# Patient Record
Sex: Female | Born: 1940 | Race: Black or African American | Hispanic: No | Marital: Single | Smoking: Former smoker
Health system: Southern US, Community
[De-identification: ages and names within clinical notes are randomized; demographics above are authoritative.]

## PROBLEM LIST (undated history)

## (undated) ENCOUNTER — Inpatient Hospital Stay: Admission: EM | Payer: Self-pay | Source: Home / Self Care

## (undated) DIAGNOSIS — M1990 Unspecified osteoarthritis, unspecified site: Secondary | ICD-10-CM

## (undated) DIAGNOSIS — M109 Gout, unspecified: Secondary | ICD-10-CM

## (undated) DIAGNOSIS — E042 Nontoxic multinodular goiter: Secondary | ICD-10-CM

## (undated) DIAGNOSIS — C801 Malignant (primary) neoplasm, unspecified: Secondary | ICD-10-CM

## (undated) DIAGNOSIS — I10 Essential (primary) hypertension: Secondary | ICD-10-CM

## (undated) DIAGNOSIS — E119 Type 2 diabetes mellitus without complications: Secondary | ICD-10-CM

## (undated) DIAGNOSIS — G629 Polyneuropathy, unspecified: Secondary | ICD-10-CM

## (undated) DIAGNOSIS — I82409 Acute embolism and thrombosis of unspecified deep veins of unspecified lower extremity: Secondary | ICD-10-CM

## (undated) DIAGNOSIS — D696 Thrombocytopenia, unspecified: Secondary | ICD-10-CM

## (undated) DIAGNOSIS — G4730 Sleep apnea, unspecified: Secondary | ICD-10-CM

## (undated) DIAGNOSIS — E559 Vitamin D deficiency, unspecified: Secondary | ICD-10-CM

## (undated) DIAGNOSIS — E669 Obesity, unspecified: Secondary | ICD-10-CM

## (undated) DIAGNOSIS — I739 Peripheral vascular disease, unspecified: Secondary | ICD-10-CM

## (undated) DIAGNOSIS — E7800 Pure hypercholesterolemia, unspecified: Secondary | ICD-10-CM

## (undated) HISTORY — PX: MASTECTOMY: SHX3

## (undated) HISTORY — PX: BREAST SURGERY: SHX581

## (undated) HISTORY — PX: WISDOM TOOTH EXTRACTION: SHX21

## (undated) HISTORY — PX: JOINT REPLACEMENT: SHX530

---

## 2004-05-30 ENCOUNTER — Ambulatory Visit: Payer: Self-pay | Admitting: Internal Medicine

## 2005-06-18 ENCOUNTER — Ambulatory Visit: Payer: Self-pay | Admitting: Internal Medicine

## 2006-02-13 ENCOUNTER — Ambulatory Visit: Payer: Self-pay | Admitting: Internal Medicine

## 2006-02-23 ENCOUNTER — Ambulatory Visit: Payer: Self-pay | Admitting: Internal Medicine

## 2006-03-12 ENCOUNTER — Ambulatory Visit: Payer: Self-pay | Admitting: Otolaryngology

## 2006-03-30 ENCOUNTER — Ambulatory Visit: Payer: Self-pay | Admitting: Otolaryngology

## 2006-07-13 ENCOUNTER — Ambulatory Visit: Payer: Self-pay | Admitting: Internal Medicine

## 2006-08-28 ENCOUNTER — Ambulatory Visit: Payer: Self-pay | Admitting: Internal Medicine

## 2006-10-13 ENCOUNTER — Ambulatory Visit: Payer: Self-pay | Admitting: Otolaryngology

## 2006-12-17 ENCOUNTER — Ambulatory Visit: Payer: Self-pay | Admitting: Oncology

## 2006-12-28 ENCOUNTER — Ambulatory Visit: Payer: Self-pay | Admitting: Oncology

## 2007-01-14 ENCOUNTER — Other Ambulatory Visit: Payer: Self-pay

## 2007-01-14 ENCOUNTER — Ambulatory Visit: Payer: Self-pay | Admitting: General Surgery

## 2007-01-17 ENCOUNTER — Ambulatory Visit: Payer: Self-pay | Admitting: Oncology

## 2007-01-19 ENCOUNTER — Ambulatory Visit: Payer: Self-pay | Admitting: General Surgery

## 2007-02-16 ENCOUNTER — Ambulatory Visit: Payer: Self-pay | Admitting: Oncology

## 2007-04-23 ENCOUNTER — Ambulatory Visit: Payer: Self-pay | Admitting: Oncology

## 2007-04-27 ENCOUNTER — Ambulatory Visit: Payer: Self-pay | Admitting: Oncology

## 2007-05-19 ENCOUNTER — Ambulatory Visit: Payer: Self-pay | Admitting: Oncology

## 2007-07-19 ENCOUNTER — Ambulatory Visit: Payer: Self-pay | Admitting: Oncology

## 2007-07-20 ENCOUNTER — Ambulatory Visit: Payer: Self-pay | Admitting: Oncology

## 2007-08-06 ENCOUNTER — Ambulatory Visit: Payer: Self-pay | Admitting: Otolaryngology

## 2007-08-19 ENCOUNTER — Ambulatory Visit: Payer: Self-pay | Admitting: Oncology

## 2007-09-10 ENCOUNTER — Ambulatory Visit: Payer: Self-pay | Admitting: Internal Medicine

## 2007-10-17 ENCOUNTER — Ambulatory Visit: Payer: Self-pay | Admitting: Oncology

## 2007-10-18 ENCOUNTER — Ambulatory Visit: Payer: Self-pay | Admitting: Oncology

## 2007-11-17 ENCOUNTER — Ambulatory Visit: Payer: Self-pay | Admitting: Oncology

## 2008-01-17 ENCOUNTER — Ambulatory Visit: Payer: Self-pay | Admitting: Oncology

## 2008-01-25 ENCOUNTER — Ambulatory Visit: Payer: Self-pay | Admitting: Oncology

## 2008-02-07 ENCOUNTER — Ambulatory Visit: Payer: Self-pay | Admitting: Internal Medicine

## 2008-02-16 ENCOUNTER — Ambulatory Visit: Payer: Self-pay | Admitting: Oncology

## 2008-02-17 ENCOUNTER — Ambulatory Visit: Payer: Self-pay | Admitting: Orthopedic Surgery

## 2008-05-16 ENCOUNTER — Ambulatory Visit: Payer: Self-pay | Admitting: Vascular Surgery

## 2008-06-19 ENCOUNTER — Ambulatory Visit: Payer: Self-pay | Admitting: Orthopedic Surgery

## 2008-06-27 ENCOUNTER — Inpatient Hospital Stay: Payer: Self-pay | Admitting: Orthopedic Surgery

## 2008-08-03 ENCOUNTER — Encounter: Payer: Self-pay | Admitting: Orthopedic Surgery

## 2008-08-18 ENCOUNTER — Encounter: Payer: Self-pay | Admitting: Orthopedic Surgery

## 2008-08-22 ENCOUNTER — Ambulatory Visit: Payer: Self-pay | Admitting: Internal Medicine

## 2008-09-19 ENCOUNTER — Ambulatory Visit: Payer: Self-pay | Admitting: Vascular Surgery

## 2008-09-20 ENCOUNTER — Encounter: Payer: Self-pay | Admitting: Orthopedic Surgery

## 2008-09-28 ENCOUNTER — Ambulatory Visit: Payer: Self-pay | Admitting: Orthopedic Surgery

## 2008-10-09 ENCOUNTER — Ambulatory Visit: Payer: Self-pay | Admitting: Internal Medicine

## 2009-02-26 ENCOUNTER — Ambulatory Visit: Payer: Self-pay | Admitting: Orthopedic Surgery

## 2009-05-23 ENCOUNTER — Encounter: Payer: Self-pay | Admitting: Orthopedic Surgery

## 2009-10-16 ENCOUNTER — Ambulatory Visit: Payer: Self-pay | Admitting: Oncology

## 2009-10-16 ENCOUNTER — Ambulatory Visit: Payer: Self-pay | Admitting: Internal Medicine

## 2009-10-19 ENCOUNTER — Ambulatory Visit: Payer: Self-pay | Admitting: Internal Medicine

## 2009-10-30 ENCOUNTER — Ambulatory Visit: Payer: Self-pay | Admitting: Oncology

## 2009-11-16 ENCOUNTER — Ambulatory Visit: Payer: Self-pay | Admitting: Oncology

## 2009-11-19 ENCOUNTER — Ambulatory Visit: Payer: Self-pay | Admitting: Surgery

## 2009-12-04 ENCOUNTER — Ambulatory Visit: Payer: Self-pay | Admitting: Surgery

## 2009-12-11 ENCOUNTER — Ambulatory Visit: Payer: Self-pay | Admitting: Surgery

## 2009-12-11 ENCOUNTER — Ambulatory Visit: Payer: Self-pay | Admitting: Oncology

## 2009-12-16 ENCOUNTER — Ambulatory Visit: Payer: Self-pay | Admitting: Oncology

## 2009-12-19 ENCOUNTER — Ambulatory Visit: Payer: Self-pay | Admitting: Surgery

## 2010-01-16 ENCOUNTER — Ambulatory Visit: Payer: Self-pay | Admitting: Oncology

## 2010-01-28 ENCOUNTER — Ambulatory Visit: Payer: Self-pay | Admitting: Surgery

## 2010-01-29 ENCOUNTER — Ambulatory Visit: Payer: Self-pay | Admitting: Oncology

## 2010-02-01 ENCOUNTER — Ambulatory Visit: Payer: Self-pay | Admitting: Surgery

## 2010-02-15 ENCOUNTER — Ambulatory Visit: Payer: Self-pay | Admitting: Oncology

## 2010-03-18 ENCOUNTER — Ambulatory Visit: Payer: Self-pay | Admitting: Oncology

## 2010-04-18 ENCOUNTER — Ambulatory Visit: Payer: Self-pay | Admitting: Oncology

## 2010-05-18 ENCOUNTER — Ambulatory Visit: Payer: Self-pay | Admitting: Oncology

## 2010-06-18 ENCOUNTER — Ambulatory Visit: Payer: Self-pay | Admitting: Oncology

## 2010-07-18 ENCOUNTER — Ambulatory Visit: Payer: Self-pay | Admitting: Oncology

## 2010-08-18 ENCOUNTER — Ambulatory Visit: Payer: Self-pay | Admitting: Radiation Oncology

## 2010-08-18 ENCOUNTER — Ambulatory Visit: Payer: Self-pay | Admitting: Oncology

## 2010-09-18 ENCOUNTER — Ambulatory Visit: Payer: Self-pay | Admitting: Oncology

## 2010-09-26 ENCOUNTER — Ambulatory Visit: Payer: Self-pay | Admitting: Otolaryngology

## 2010-11-12 IMAGING — CT CT CHEST-ABD-PELV W/ CM
1 of 3 series · 14 of 32 positions shown, 19 images · non-contrast
Comparison: none

REASON FOR EXAM: breast CA  eval mets  pt is diabetic takes Metformin
COMMENTS:

[Series 3: soft tissue · axial · 0.93mm/px · z∈[-601,-216]mm · 14 of 89 slices shown, 19 images]
[im 6/89  soft-tissue]
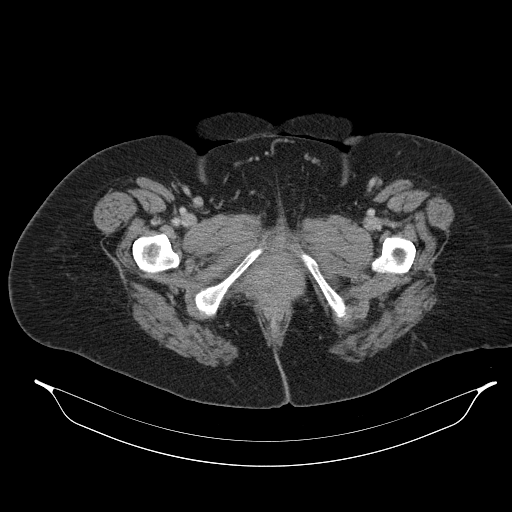
[im 6/89  bone]
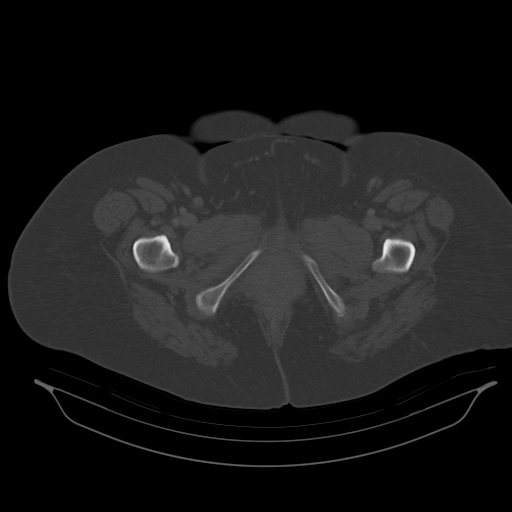
[im 11/89  soft-tissue]
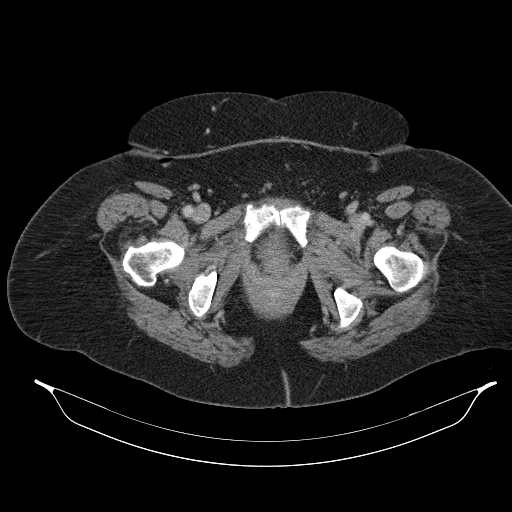
[im 21/89  soft-tissue]
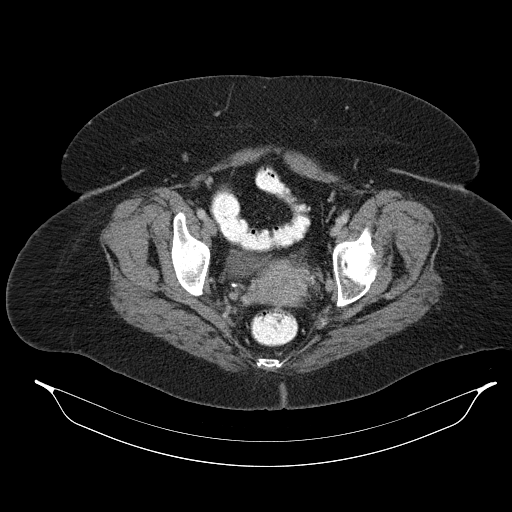
[im 26/89  soft-tissue]
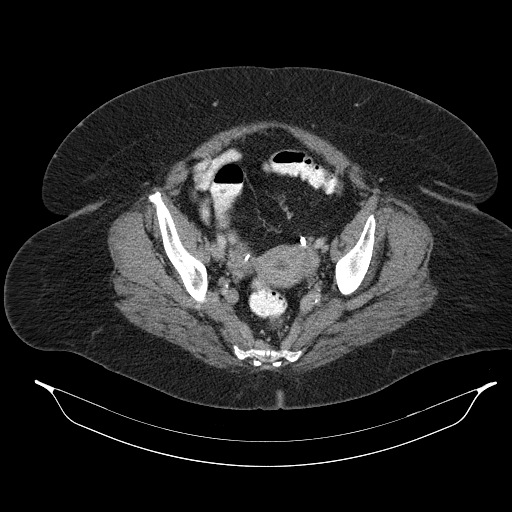
[im 32/89  soft-tissue]
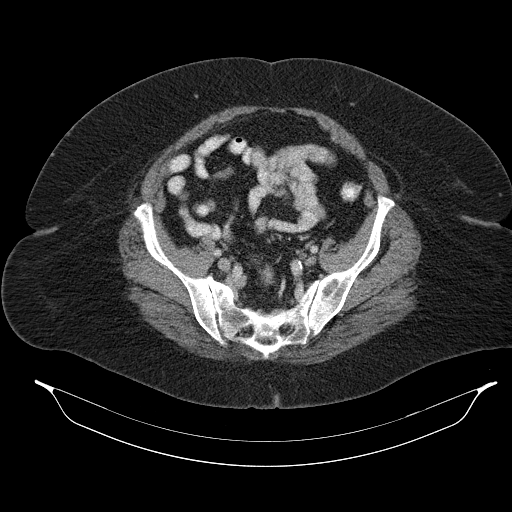
[im 37/89  soft-tissue]
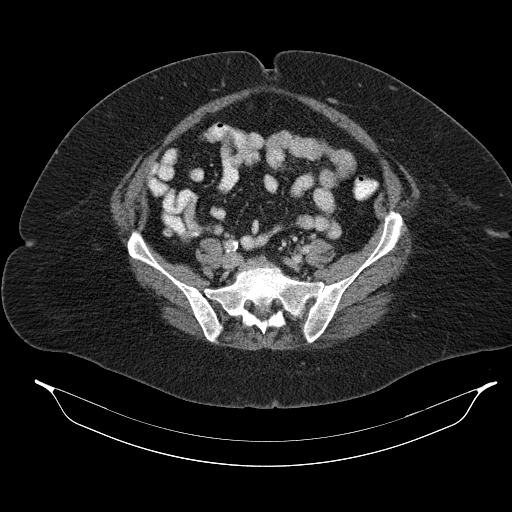
[im 47/89  soft-tissue]
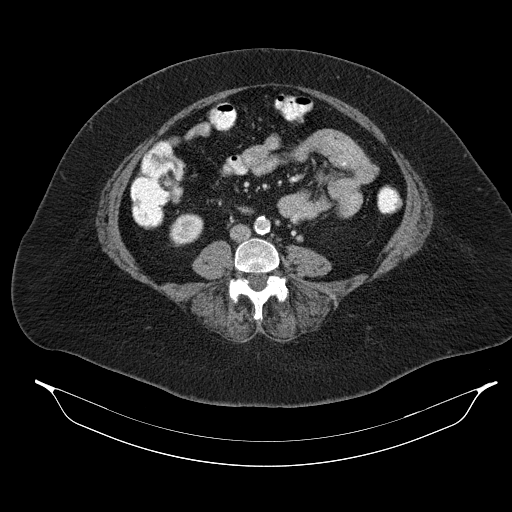
[im 52/89  soft-tissue]
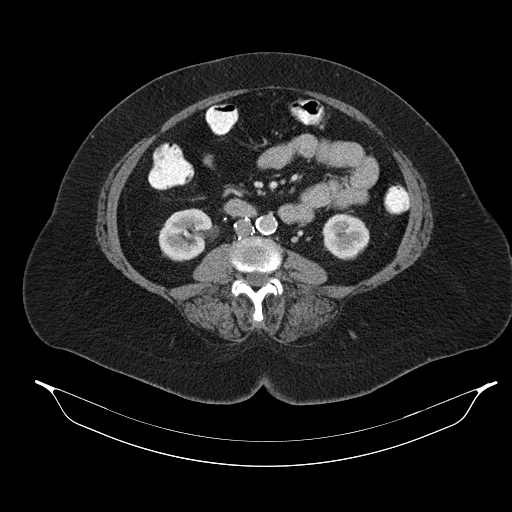
[im 57/89  soft-tissue]
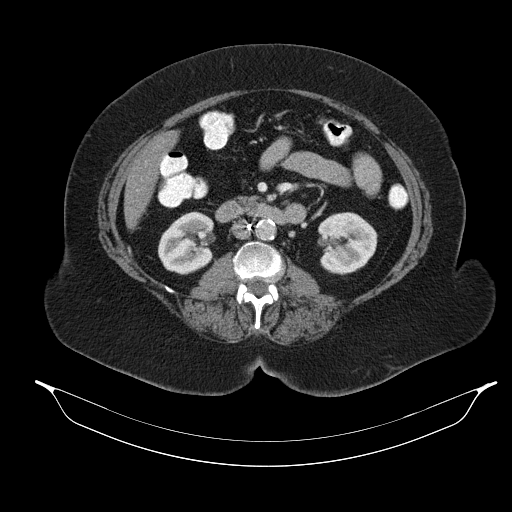
[im 57/89  bone]
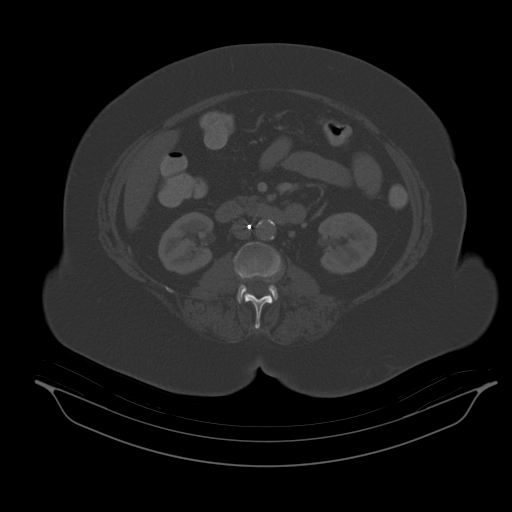
[im 63/89  soft-tissue]
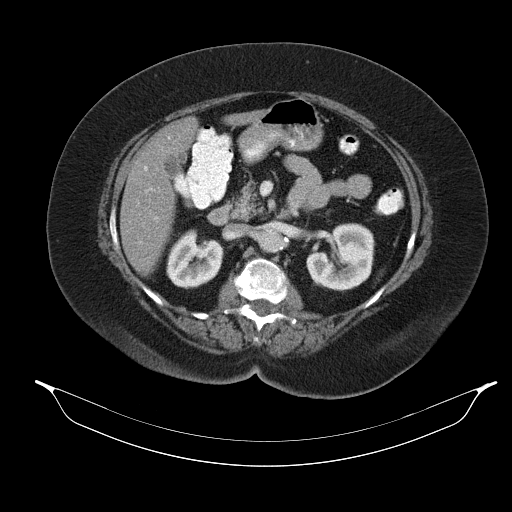
[im 68/89  soft-tissue]
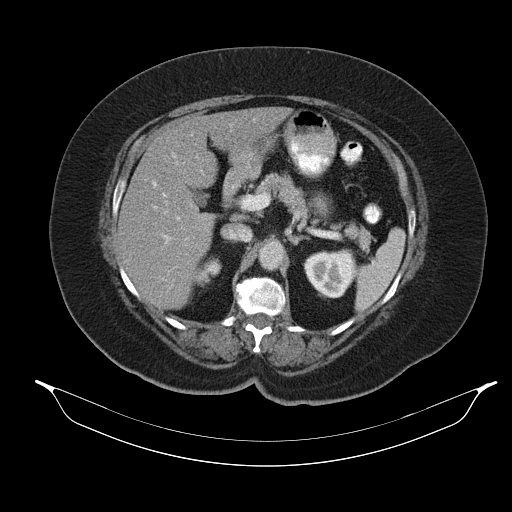
[im 68/89  lung]
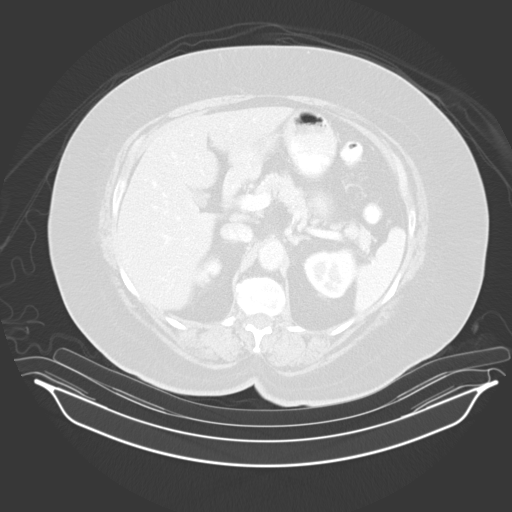
[im 73/89  lung]
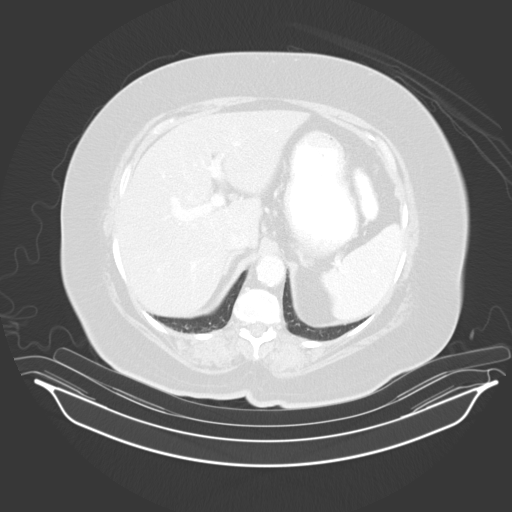
[im 78/89  soft-tissue]
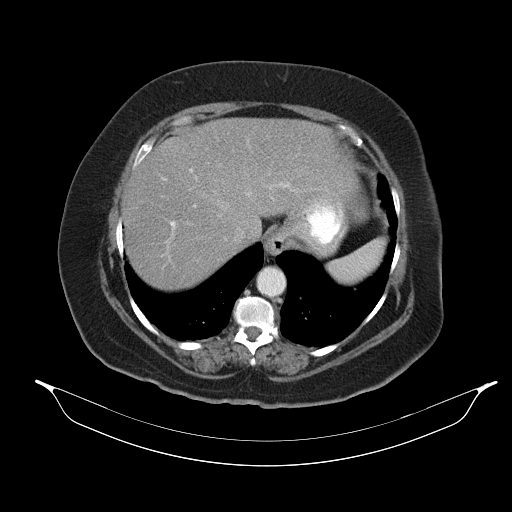
[im 78/89  lung]
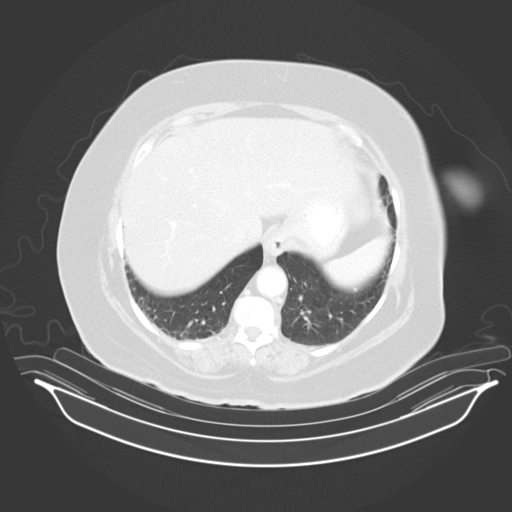
[im 83/89  soft-tissue]
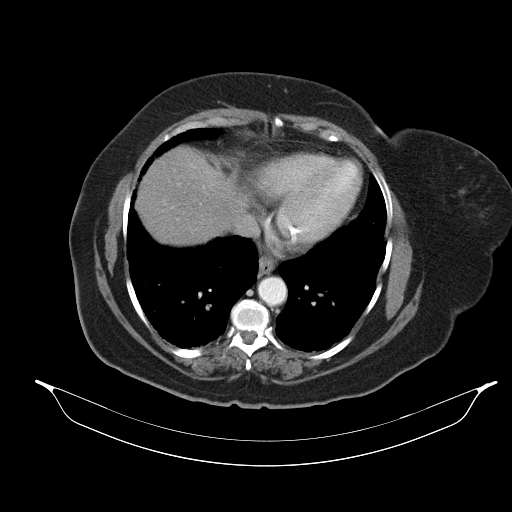
[im 83/89  lung]
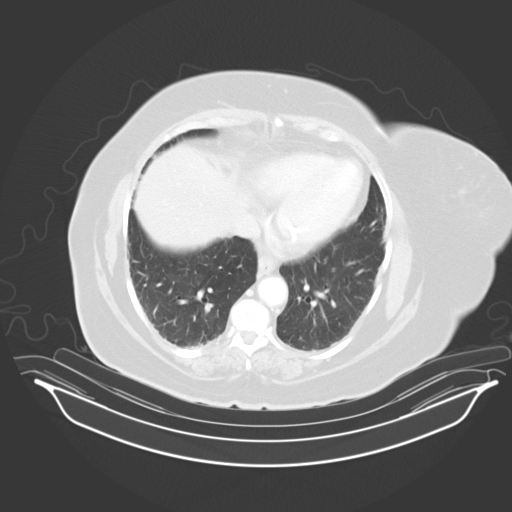

[14 of 32 positions shown; findings below may reference images not displayed]

PROCEDURE:     NAGRAJ - NAGRAJ CHEST ABDOMEN AND PELVIS W  - December 04, 2009  [DATE]

RESULT:     CT of the chest, abdomen and pelvis is performed utilizing 100
mL of 0sovue-SOS iodinated intravenous contrast with oral contrast also
administered. The patient has previous CT dated 12/30/2006 for comparison.
There is also a study dated 02/13/2006.

The patient is status post right mastectomy. No pleural or pericardial
effusion is evident. The heart is borderline enlarged. The thoracic aorta is
normal in caliber without dissection. A definite pulmonary parenchymal mass
is not appreciated. There is no mediastinal, hilar or axillary mass or
adenopathy evident.

The liver, spleen, adrenal glands, pancreas, nondistended gallbladder and
abdominal aorta appear to be unremarkable except for atherosclerotic
calcification. An inferior vena cava filter is present. The legs of the
filter extend past the wall of the inferior vena cava. The uterus and
urinary bladder appear to be unremarkable. There is no evidence of abnormal
bowel distention or bowel wall thickening. The appendix is normal. No
adenopathy is evident. There is no ascites or abscess. The bony structures
are unremarkable.
IMPRESSION: 1. Right mastectomy changes.
2. No focal pulmonary parenchymal mass.
3. Inferior vena cava filter present as described.
4. No adenopathy or metastatic disease demonstrated.

## 2010-12-17 ENCOUNTER — Ambulatory Visit: Payer: Self-pay | Admitting: Oncology

## 2010-12-25 ENCOUNTER — Ambulatory Visit: Payer: Self-pay | Admitting: Otolaryngology

## 2011-01-17 ENCOUNTER — Ambulatory Visit: Payer: Self-pay | Admitting: Oncology

## 2011-03-14 ENCOUNTER — Ambulatory Visit: Payer: Self-pay | Admitting: Oncology

## 2011-03-19 ENCOUNTER — Ambulatory Visit: Payer: Self-pay | Admitting: Oncology

## 2011-06-27 ENCOUNTER — Ambulatory Visit: Payer: Self-pay | Admitting: Otolaryngology

## 2012-03-12 ENCOUNTER — Ambulatory Visit: Payer: Self-pay | Admitting: Oncology

## 2012-03-18 ENCOUNTER — Ambulatory Visit: Payer: Self-pay | Admitting: Oncology

## 2012-04-16 LAB — COMPREHENSIVE METABOLIC PANEL
Alkaline Phosphatase: 99 U/L (ref 50–136)
Anion Gap: 6 — ABNORMAL LOW (ref 7–16)
Calcium, Total: 9.2 mg/dL (ref 8.5–10.1)
Chloride: 105 mmol/L (ref 98–107)
Co2: 29 mmol/L (ref 21–32)
Creatinine: 0.81 mg/dL (ref 0.60–1.30)
EGFR (African American): >60
EGFR (Non-African Amer.): >60
Osmolality: 286 (ref 275–301)
SGOT(AST): 23 U/L (ref 15–37)
SGPT (ALT): 35 U/L (ref 12–78)

## 2012-04-16 LAB — CBC CANCER CENTER
Basophil #: 0 x10 3/mm (ref 0.0–0.1)
Basophil %: 0.5 %
Eosinophil %: 3.8 %
HCT: 41.8 % (ref 35.0–47.0)
HGB: 13.6 g/dL (ref 12.0–16.0)
Lymphocyte %: 20 %
Monocyte #: 0.6 x10 3/mm (ref 0.2–0.9)
Monocyte %: 8.5 %
Neutrophil #: 4.5 x10 3/mm (ref 1.4–6.5)
Neutrophil %: 67.2 %
RBC: 4.42 10*6/uL (ref 3.80–5.20)
RDW: 16.3 % — ABNORMAL HIGH (ref 11.5–14.5)
WBC: 6.7 x10 3/mm (ref 3.6–11.0)

## 2012-04-18 ENCOUNTER — Ambulatory Visit: Payer: Self-pay | Admitting: Oncology

## 2012-05-06 ENCOUNTER — Ambulatory Visit: Payer: Self-pay | Admitting: Surgery

## 2012-05-14 ENCOUNTER — Ambulatory Visit: Payer: Self-pay | Admitting: Surgery

## 2012-06-30 ENCOUNTER — Ambulatory Visit: Payer: Self-pay | Admitting: Otolaryngology

## 2012-06-30 LAB — TSH: Thyroid Stimulating Horm: 1.11 u[IU]/mL

## 2012-09-28 ENCOUNTER — Ambulatory Visit: Payer: Self-pay | Admitting: Internal Medicine

## 2012-10-16 ENCOUNTER — Ambulatory Visit: Payer: Self-pay | Admitting: Internal Medicine

## 2012-11-05 ENCOUNTER — Ambulatory Visit: Payer: Self-pay | Admitting: Oncology

## 2012-11-05 LAB — CBC CANCER CENTER
Basophil #: 0.1 x10 3/mm (ref 0.0–0.1)
Basophil %: 0.7 %
Eosinophil #: 0.3 x10 3/mm (ref 0.0–0.7)
Eosinophil %: 4.2 %
HCT: 39.3 % (ref 35.0–47.0)
Lymphocyte #: 1.9 x10 3/mm (ref 1.0–3.6)
Lymphocyte %: 23.6 %
MCHC: 31.9 g/dL — ABNORMAL LOW (ref 32.0–36.0)
MCV: 94 fL (ref 80–100)
Monocyte #: 0.8 x10 3/mm (ref 0.2–0.9)
Neutrophil %: 61.3 %
Platelet: 150 x10 3/mm (ref 150–440)

## 2012-11-05 LAB — COMPREHENSIVE METABOLIC PANEL
Albumin: 3.5 g/dL (ref 3.4–5.0)
Alkaline Phosphatase: 93 U/L (ref 50–136)
BUN: 40 mg/dL — ABNORMAL HIGH (ref 7–18)
Bilirubin,Total: 0.4 mg/dL (ref 0.2–1.0)
Chloride: 102 mmol/L (ref 98–107)
Co2: 27 mmol/L (ref 21–32)
Creatinine: 1.11 mg/dL (ref 0.60–1.30)
Glucose: 178 mg/dL — ABNORMAL HIGH (ref 65–99)
Osmolality: 294 (ref 275–301)
Potassium: 4.1 mmol/L (ref 3.5–5.1)
SGOT(AST): 21 U/L (ref 15–37)
SGPT (ALT): 30 U/L (ref 12–78)

## 2012-11-16 ENCOUNTER — Ambulatory Visit: Payer: Self-pay | Admitting: Oncology

## 2012-12-21 ENCOUNTER — Ambulatory Visit: Payer: Self-pay | Admitting: Internal Medicine

## 2013-01-16 ENCOUNTER — Ambulatory Visit: Payer: Self-pay | Admitting: Internal Medicine

## 2013-02-15 ENCOUNTER — Ambulatory Visit: Payer: Self-pay | Admitting: Oncology

## 2013-03-18 ENCOUNTER — Ambulatory Visit: Payer: Self-pay | Admitting: Oncology

## 2013-04-18 ENCOUNTER — Ambulatory Visit: Payer: Self-pay | Admitting: Oncology

## 2013-05-06 LAB — CBC CANCER CENTER
Basophil %: 0.7 %
Eosinophil #: 0.2 x10 3/mm (ref 0.0–0.7)
Eosinophil %: 3.2 %
HCT: 39.5 % (ref 35.0–47.0)
HGB: 12.7 g/dL (ref 12.0–16.0)
Lymphocyte #: 1.4 x10 3/mm (ref 1.0–3.6)
Lymphocyte %: 20.4 %
MCH: 29.8 pg (ref 26.0–34.0)
MCHC: 32.3 g/dL (ref 32.0–36.0)
MCV: 92 fL (ref 80–100)
Monocyte #: 0.6 x10 3/mm (ref 0.2–0.9)
Neutrophil %: 67.1 %
Platelet: 145 x10 3/mm — ABNORMAL LOW (ref 150–440)
RBC: 4.28 10*6/uL (ref 3.80–5.20)
RDW: 15.2 % — ABNORMAL HIGH (ref 11.5–14.5)
WBC: 7 x10 3/mm (ref 3.6–11.0)

## 2013-05-06 LAB — COMPREHENSIVE METABOLIC PANEL
Albumin: 3.6 g/dL (ref 3.4–5.0)
Anion Gap: 7 (ref 7–16)
BUN: 26 mg/dL — ABNORMAL HIGH (ref 7–18)
Bilirubin,Total: 0.4 mg/dL (ref 0.2–1.0)
Chloride: 108 mmol/L — ABNORMAL HIGH (ref 98–107)
Co2: 29 mmol/L (ref 21–32)
Creatinine: 0.94 mg/dL (ref 0.60–1.30)
EGFR (Non-African Amer.): 60 — ABNORMAL LOW
Glucose: 204 mg/dL — ABNORMAL HIGH (ref 65–99)
Osmolality: 297 (ref 275–301)
Potassium: 4.2 mmol/L (ref 3.5–5.1)
SGPT (ALT): 40 U/L (ref 12–78)
Total Protein: 7.7 g/dL (ref 6.4–8.2)

## 2013-05-18 ENCOUNTER — Ambulatory Visit: Payer: Self-pay | Admitting: Oncology

## 2013-07-28 ENCOUNTER — Ambulatory Visit: Payer: Self-pay | Admitting: Otolaryngology

## 2013-07-29 LAB — TSH: Thyroid Stimulating Horm: 1.9 u[IU]/mL

## 2013-07-29 LAB — T4, FREE: Free Thyroxine: 1.08 ng/dL (ref 0.76–1.46)

## 2013-09-20 ENCOUNTER — Emergency Department: Payer: Self-pay | Admitting: Emergency Medicine

## 2013-11-09 ENCOUNTER — Ambulatory Visit: Payer: Self-pay | Admitting: Oncology

## 2013-11-16 ENCOUNTER — Ambulatory Visit: Payer: Self-pay | Admitting: Oncology

## 2013-11-26 DIAGNOSIS — D649 Anemia, unspecified: Secondary | ICD-10-CM | POA: Insufficient documentation

## 2013-11-26 DIAGNOSIS — Z9660 Presence of unspecified orthopedic joint implant: Secondary | ICD-10-CM | POA: Insufficient documentation

## 2013-11-26 DIAGNOSIS — L609 Nail disorder, unspecified: Secondary | ICD-10-CM | POA: Insufficient documentation

## 2013-11-26 DIAGNOSIS — E7800 Pure hypercholesterolemia, unspecified: Secondary | ICD-10-CM | POA: Insufficient documentation

## 2013-11-26 DIAGNOSIS — I10 Essential (primary) hypertension: Secondary | ICD-10-CM | POA: Insufficient documentation

## 2013-12-29 DIAGNOSIS — J84112 Idiopathic pulmonary fibrosis: Secondary | ICD-10-CM | POA: Insufficient documentation

## 2013-12-29 DIAGNOSIS — E669 Obesity, unspecified: Secondary | ICD-10-CM | POA: Insufficient documentation

## 2013-12-29 DIAGNOSIS — G4733 Obstructive sleep apnea (adult) (pediatric): Secondary | ICD-10-CM | POA: Insufficient documentation

## 2014-04-13 DIAGNOSIS — E119 Type 2 diabetes mellitus without complications: Secondary | ICD-10-CM | POA: Insufficient documentation

## 2014-04-13 DIAGNOSIS — M109 Gout, unspecified: Secondary | ICD-10-CM | POA: Insufficient documentation

## 2014-05-20 DIAGNOSIS — M1710 Unilateral primary osteoarthritis, unspecified knee: Secondary | ICD-10-CM | POA: Insufficient documentation

## 2014-05-20 DIAGNOSIS — M179 Osteoarthritis of knee, unspecified: Secondary | ICD-10-CM | POA: Insufficient documentation

## 2014-05-20 DIAGNOSIS — E1140 Type 2 diabetes mellitus with diabetic neuropathy, unspecified: Secondary | ICD-10-CM | POA: Insufficient documentation

## 2014-05-20 DIAGNOSIS — E559 Vitamin D deficiency, unspecified: Secondary | ICD-10-CM | POA: Insufficient documentation

## 2014-05-26 ENCOUNTER — Ambulatory Visit: Payer: Self-pay | Admitting: Oncology

## 2014-05-26 LAB — CBC CANCER CENTER
Basophil #: 0.1 x10 3/mm (ref 0.0–0.1)
Basophil %: 0.7 %
EOS ABS: 0.3 x10 3/mm (ref 0.0–0.7)
Eosinophil %: 4.8 %
HCT: 39.3 % (ref 35.0–47.0)
HGB: 12.7 g/dL (ref 12.0–16.0)
LYMPHS ABS: 1.9 x10 3/mm (ref 1.0–3.6)
Lymphocyte %: 26.1 %
MCH: 29.1 pg (ref 26.0–34.0)
MCHC: 32.2 g/dL (ref 32.0–36.0)
MCV: 90 fL (ref 80–100)
Monocyte #: 0.7 x10 3/mm (ref 0.2–0.9)
Monocyte %: 9.8 %
NEUTROS PCT: 58.6 %
Neutrophil #: 4.2 x10 3/mm (ref 1.4–6.5)
Platelet: 149 x10 3/mm — ABNORMAL LOW (ref 150–440)
RBC: 4.36 10*6/uL (ref 3.80–5.20)
RDW: 15.9 % — ABNORMAL HIGH (ref 11.5–14.5)
WBC: 7.2 x10 3/mm (ref 3.6–11.0)

## 2014-05-26 LAB — COMPREHENSIVE METABOLIC PANEL
ALBUMIN: 3.5 g/dL (ref 3.4–5.0)
ALK PHOS: 79 U/L
ALT: 21 U/L
ANION GAP: 6 — AB (ref 7–16)
AST: 18 U/L (ref 15–37)
BUN: 23 mg/dL — ABNORMAL HIGH (ref 7–18)
Bilirubin,Total: 0.3 mg/dL (ref 0.2–1.0)
CHLORIDE: 104 mmol/L (ref 98–107)
CREATININE: 1.02 mg/dL (ref 0.60–1.30)
Calcium, Total: 9.1 mg/dL (ref 8.5–10.1)
Co2: 30 mmol/L (ref 21–32)
EGFR (Non-African Amer.): 56 — ABNORMAL LOW
Glucose: 246 mg/dL — ABNORMAL HIGH (ref 65–99)
Osmolality: 291 (ref 275–301)
Potassium: 4.6 mmol/L (ref 3.5–5.1)
Sodium: 140 mmol/L (ref 136–145)
Total Protein: 7.4 g/dL (ref 6.4–8.2)

## 2014-06-18 ENCOUNTER — Ambulatory Visit: Payer: Self-pay | Admitting: Oncology

## 2014-07-31 ENCOUNTER — Ambulatory Visit: Payer: Self-pay | Admitting: Otolaryngology

## 2014-07-31 LAB — T4, FREE: Free Thyroxine: 1.05 ng/dL (ref 0.76–1.46)

## 2014-07-31 LAB — TSH: Thyroid Stimulating Horm: 1.44 u[IU]/mL

## 2014-11-22 ENCOUNTER — Ambulatory Visit
Admit: 2014-11-22 | Disposition: A | Discharge status: Home / Self Care | Payer: Self-pay | Attending: Hematology and Oncology | Admitting: Hematology and Oncology

## 2014-11-22 LAB — CBC CANCER CENTER
Basophil #: 0 x10 3/mm (ref 0.0–0.1)
Basophil %: 0.6 %
Eosinophil #: 0.2 x10 3/mm (ref 0.0–0.7)
Eosinophil %: 3.2 %
HCT: 40.4 % (ref 35.0–47.0)
HGB: 13.2 g/dL (ref 12.0–16.0)
Lymphocyte #: 1.5 x10 3/mm (ref 1.0–3.6)
Lymphocyte %: 20.6 %
MCH: 28.8 pg (ref 26.0–34.0)
MCHC: 32.7 g/dL (ref 32.0–36.0)
MCV: 88 fL (ref 80–100)
Monocyte #: 0.7 x10 3/mm (ref 0.2–0.9)
Monocyte %: 8.9 %
Neutrophil #: 5 x10 3/mm (ref 1.4–6.5)
Neutrophil %: 66.7 %
Platelet: 172 x10 3/mm (ref 150–440)
RBC: 4.58 10*6/uL (ref 3.80–5.20)
RDW: 15 % — ABNORMAL HIGH (ref 11.5–14.5)
WBC: 7.5 x10 3/mm (ref 3.6–11.0)

## 2014-11-22 LAB — COMPREHENSIVE METABOLIC PANEL
Albumin: 4.4 g/dL
Alkaline Phosphatase: 68 U/L
Anion Gap: 8 (ref 7–16)
BUN: 24 mg/dL — ABNORMAL HIGH
Bilirubin,Total: 0.6 mg/dL
Calcium, Total: 9.5 mg/dL
Chloride: 100 mmol/L — ABNORMAL LOW
Co2: 29 mmol/L
Creatinine: 0.78 mg/dL
EGFR (African American): >60
EGFR (Non-African Amer.): >60
Glucose: 272 mg/dL — ABNORMAL HIGH
Potassium: 4.4 mmol/L
SGOT(AST): 21 U/L
SGPT (ALT): 17 U/L
Sodium: 137 mmol/L
Total Protein: 7.7 g/dL

## 2014-11-23 LAB — CANCER ANTIGEN 27.29: CA 27.29: 39.1 U/mL — ABNORMAL HIGH (ref 0.0–38.6)

## 2014-11-24 DIAGNOSIS — Z79899 Other long term (current) drug therapy: Secondary | ICD-10-CM | POA: Insufficient documentation

## 2014-11-24 DIAGNOSIS — J301 Allergic rhinitis due to pollen: Secondary | ICD-10-CM | POA: Insufficient documentation

## 2014-12-05 NOTE — Op Note (Signed)
PATIENT NAME:  Crystal Frederick, Crystal Frederick MR#:  [id number] DATE OF BIRTH:  11-Jun-1941  DATE OF PROCEDURE:  05/14/2012  PREOPERATIVE DIAGNOSIS: Breast carcinoma.   POSTOPERATIVE DIAGNOSIS: Breast carcinoma.   PROCEDURE: Port-A-Cath removal.   SURGEON: Rodena Goldmann III, MD  ASSISTANT: Doristine Locks, PA student   ANESTHESIA: Monitored anesthetic care    DESCRIPTION OF PROCEDURE: With the patient in supine position after induction of appropriate intravenous sedation, the patient's right chest and Port-A-Cath device were prepped with ChloraPrep and draped with sterile towels. 1% Xylocaine buffered with sodium bicarbonate was injected over the palpable nodule. The area was incised and carried down through the subcutaneous tissue, bovied with electrocautery. The device was easily elevated into the wound and detached from surrounding tissue without difficulty. The catheter was then removed. Did not appear to be any evidence of a break in the catheter and the catheter was removed in total. The sinus tract was closed with two sutures of 3-0 Vicryl as it was bleeding spontaneously. Subcutaneous space obliterated with 3-0 Vicryl and the skin was closed 4-0 nylon. Sterile dressings were applied. Patient returned to the recovery room having tolerated procedure well. Sponge, instrument, and needle counts were correct x2 in the Operating Room.   ____________________________ Micheline Maze, MD rle:cms D: 05/14/2012 10:19:35 ET T: 05/14/2012 12:32:26 ET JOB#: [id number]  cc: Micheline Maze, MD, <Dictator> Christena Flake. Raechel Ache, MD  Rodena Goldmann MD ELECTRONICALLY SIGNED 05/16/2012 16:27

## 2014-12-14 LAB — CANCER ANTIGEN 27.29: CA 27.29: 44.1 U/mL — ABNORMAL HIGH (ref 0.0–38.6)

## 2014-12-21 DIAGNOSIS — I6381 Other cerebral infarction due to occlusion or stenosis of small artery: Secondary | ICD-10-CM | POA: Insufficient documentation

## 2014-12-28 ENCOUNTER — Other Ambulatory Visit: Payer: Self-pay | Admitting: Family Medicine

## 2015-01-01 ENCOUNTER — Other Ambulatory Visit: Payer: Self-pay

## 2015-01-01 ENCOUNTER — Telehealth: Payer: Self-pay | Admitting: Hematology and Oncology

## 2015-01-01 MED ORDER — ANASTROZOLE 1 MG PO TABS: 1.0000 mg | ORAL_TABLET | Freq: Every day | ORAL | Status: DC

## 2015-01-01 NOTE — Telephone Encounter (Signed)
She needs her Anastrazole prescription renewed. She went to pharmacy to get it and they said they needed a new Rx. She goes to [facility]. This is the second day that she will be without it.

## 2015-01-01 NOTE — Telephone Encounter (Signed)
rx sent to pharmacy

## 2015-01-24 ENCOUNTER — Other Ambulatory Visit: Payer: Self-pay

## 2015-01-24 ENCOUNTER — Inpatient Hospital Stay: Payer: Medicare Other | Attending: Hematology and Oncology

## 2015-01-24 DIAGNOSIS — C50919 Malignant neoplasm of unspecified site of unspecified female breast: Secondary | ICD-10-CM

## 2015-01-24 DIAGNOSIS — Z79811 Long term (current) use of aromatase inhibitors: Secondary | ICD-10-CM | POA: Insufficient documentation

## 2015-01-24 DIAGNOSIS — Z170 Estrogen receptor positive status [ER+]: Secondary | ICD-10-CM | POA: Insufficient documentation

## 2015-01-25 LAB — CANCER ANTIGEN 27.29: CA 27.29: 35.5 U/mL (ref 0.0–38.6)

## 2015-02-23 DIAGNOSIS — G44209 Tension-type headache, unspecified, not intractable: Secondary | ICD-10-CM | POA: Insufficient documentation

## 2015-03-05 ENCOUNTER — Ambulatory Visit: Payer: Medicare Other | Admitting: Anesthesiology

## 2015-03-05 ENCOUNTER — Ambulatory Visit
Admission: RE | Admit: 2015-03-05 | Discharge: 2015-03-05 | Disposition: A | Discharge status: Home / Self Care | Payer: Medicare Other | Source: Ambulatory Visit | Attending: Unknown Physician Specialty | Admitting: Unknown Physician Specialty

## 2015-03-05 ENCOUNTER — Encounter: Payer: Self-pay | Admitting: *Deleted

## 2015-03-05 ENCOUNTER — Encounter
Admission: RE | Disposition: A | Discharge status: Home / Self Care | Payer: Self-pay | Source: Ambulatory Visit | Attending: Unknown Physician Specialty

## 2015-03-05 DIAGNOSIS — M109 Gout, unspecified: Secondary | ICD-10-CM | POA: Insufficient documentation

## 2015-03-05 DIAGNOSIS — Z1211 Encounter for screening for malignant neoplasm of colon: Secondary | ICD-10-CM | POA: Diagnosis present

## 2015-03-05 DIAGNOSIS — K648 Other hemorrhoids: Secondary | ICD-10-CM | POA: Diagnosis not present

## 2015-03-05 DIAGNOSIS — Z86718 Personal history of other venous thrombosis and embolism: Secondary | ICD-10-CM | POA: Diagnosis not present

## 2015-03-05 DIAGNOSIS — D123 Benign neoplasm of transverse colon: Secondary | ICD-10-CM | POA: Diagnosis not present

## 2015-03-05 DIAGNOSIS — I739 Peripheral vascular disease, unspecified: Secondary | ICD-10-CM | POA: Diagnosis not present

## 2015-03-05 DIAGNOSIS — K633 Ulcer of intestine: Secondary | ICD-10-CM | POA: Diagnosis not present

## 2015-03-05 DIAGNOSIS — I10 Essential (primary) hypertension: Secondary | ICD-10-CM | POA: Insufficient documentation

## 2015-03-05 DIAGNOSIS — Z87891 Personal history of nicotine dependence: Secondary | ICD-10-CM | POA: Diagnosis not present

## 2015-03-05 DIAGNOSIS — Z79899 Other long term (current) drug therapy: Secondary | ICD-10-CM | POA: Insufficient documentation

## 2015-03-05 DIAGNOSIS — Z9660 Presence of unspecified orthopedic joint implant: Secondary | ICD-10-CM | POA: Diagnosis not present

## 2015-03-05 DIAGNOSIS — Z6836 Body mass index (BMI) 36.0-36.9, adult: Secondary | ICD-10-CM | POA: Insufficient documentation

## 2015-03-05 DIAGNOSIS — G4730 Sleep apnea, unspecified: Secondary | ICD-10-CM | POA: Insufficient documentation

## 2015-03-05 DIAGNOSIS — E780 Pure hypercholesterolemia: Secondary | ICD-10-CM | POA: Insufficient documentation

## 2015-03-05 DIAGNOSIS — E669 Obesity, unspecified: Secondary | ICD-10-CM | POA: Insufficient documentation

## 2015-03-05 DIAGNOSIS — K529 Noninfective gastroenteritis and colitis, unspecified: Secondary | ICD-10-CM | POA: Diagnosis not present

## 2015-03-05 DIAGNOSIS — E559 Vitamin D deficiency, unspecified: Secondary | ICD-10-CM | POA: Diagnosis not present

## 2015-03-05 DIAGNOSIS — K5730 Diverticulosis of large intestine without perforation or abscess without bleeding: Secondary | ICD-10-CM | POA: Insufficient documentation

## 2015-03-05 DIAGNOSIS — E119 Type 2 diabetes mellitus without complications: Secondary | ICD-10-CM | POA: Insufficient documentation

## 2015-03-05 HISTORY — DX: Obesity, unspecified: E66.9

## 2015-03-05 HISTORY — DX: Pure hypercholesterolemia, unspecified: E78.00

## 2015-03-05 HISTORY — DX: Nontoxic multinodular goiter: E04.2

## 2015-03-05 HISTORY — DX: Acute embolism and thrombosis of unspecified deep veins of unspecified lower extremity: I82.409

## 2015-03-05 HISTORY — PX: COLONOSCOPY WITH PROPOFOL: SHX5780

## 2015-03-05 HISTORY — DX: Malignant (primary) neoplasm, unspecified: C80.1

## 2015-03-05 HISTORY — DX: Essential (primary) hypertension: I10

## 2015-03-05 HISTORY — DX: Polyneuropathy, unspecified: G62.9

## 2015-03-05 HISTORY — DX: Unspecified osteoarthritis, unspecified site: M19.90

## 2015-03-05 HISTORY — DX: Sleep apnea, unspecified: G47.30

## 2015-03-05 HISTORY — DX: Peripheral vascular disease, unspecified: I73.9

## 2015-03-05 HISTORY — DX: Vitamin D deficiency, unspecified: E55.9

## 2015-03-05 HISTORY — DX: Gout, unspecified: M10.9

## 2015-03-05 HISTORY — DX: Thrombocytopenia, unspecified: D69.6

## 2015-03-05 HISTORY — DX: Type 2 diabetes mellitus without complications: E11.9

## 2015-03-05 LAB — GLUCOSE, CAPILLARY: GLUCOSE-CAPILLARY: 177 mg/dL — AB (ref 65–99)

## 2015-03-05 SURGERY — COLONOSCOPY WITH PROPOFOL
Anesthesia: General

## 2015-03-05 MED ORDER — SODIUM CHLORIDE 0.9 % IV SOLN: INTRAVENOUS | Status: DC

## 2015-03-05 MED ORDER — PIPERACILLIN-TAZOBACTAM 3.375 G IVPB 30 MIN
3.3750 g | Freq: Once | INTRAVENOUS | Status: AC
  Administered 2015-03-05: 3.375 g via INTRAVENOUS
  Filled 2015-03-05: qty 50

## 2015-03-05 MED ORDER — PROPOFOL 10 MG/ML IV BOLUS
INTRAVENOUS | Status: DC | PRN
  Administered 2015-03-05: 30 mg via INTRAVENOUS
  Administered 2015-03-05: 20 mg via INTRAVENOUS

## 2015-03-05 MED ORDER — EPHEDRINE SULFATE 50 MG/ML IJ SOLN
INTRAMUSCULAR | Status: DC | PRN
  Administered 2015-03-05: 5 mg via INTRAVENOUS
  Administered 2015-03-05 (×2): 10 mg via INTRAVENOUS

## 2015-03-05 MED ORDER — PROPOFOL INFUSION 10 MG/ML OPTIME
INTRAVENOUS | Status: DC | PRN
  Administered 2015-03-05: 100 ug/kg/min via INTRAVENOUS

## 2015-03-05 MED ORDER — SODIUM CHLORIDE 0.9 % IV SOLN
INTRAVENOUS | Status: DC
  Administered 2015-03-05 (×2): via INTRAVENOUS

## 2015-03-05 MED ORDER — FENTANYL CITRATE (PF) 100 MCG/2ML IJ SOLN
INTRAMUSCULAR | Status: DC | PRN
  Administered 2015-03-05: 50 ug via INTRAVENOUS

## 2015-03-05 MED ORDER — LIDOCAINE HCL (PF) 2 % IJ SOLN
INTRAMUSCULAR | Status: DC | PRN
  Administered 2015-03-05: 50 mg

## 2015-03-05 NOTE — Transfer of Care (Signed)
Immediate Anesthesia Transfer of Care Note  Patient: Crystal Frederick  Procedure(s) Performed: Procedure(s): COLONOSCOPY WITH PROPOFOL (N/A)  Patient Location: PACU  Anesthesia Type:General  Level of Consciousness: sedated  Airway & Oxygen Therapy: Patient Spontanous Breathing and Patient connected to nasal cannula oxygen  Post-op Assessment: Report given to RN and Post -op Vital signs reviewed and stable  Post vital signs: Reviewed and stable  Last Vitals:  Filed Vitals:   03/05/15 1013  BP: 98/55  Pulse: 87  Temp: 36.1 C  Resp: 20    Complications: No apparent anesthesia complications

## 2015-03-05 NOTE — Addendum Note (Signed)
Addendum  created 03/05/15 1145 by Elyse Hsu, MD   Modules edited: Anesthesia Review and Sign Navigator Section, Clinical Notes   Clinical Notes:  File: [id number]

## 2015-03-05 NOTE — Op Note (Signed)
[facility] Gastroenterology Patient Name: Crystal Frederick Procedure Date: 03/05/2015 9:32 AM MRN: [id number] Account #: [id number] Date of Birth: 05-05-1941 Admit Type: Outpatient Age: 74 Room: [facility] ENDO ROOM 1 Gender: Female Note Status: Finalized Procedure:         Colonoscopy Indications:       Screening for colorectal malignant neoplasm Providers:         Manya Silvas, MD Medicines:         Propofol per Anesthesia Complications:     No immediate complications. Procedure:         Pre-Anesthesia Assessment:                    - After reviewing the risks and benefits, the patient was                     deemed in satisfactory condition to undergo the procedure.                    After obtaining informed consent, the colonoscope was                     passed under direct vision. Throughout the procedure, the                     patient's blood pressure, pulse, and oxygen saturations                     were monitored continuously. The Olympus CF-Q160AL                     colonoscope (S#. [id number]) was introduced through the anus                     and advanced to the the cecum, identified by appendiceal                     orifice and ileocecal valve. The colonoscopy was performed                     without difficulty. The patient tolerated the procedure                     well. The quality of the bowel preparation was excellent. Findings:      A diminutive polyp was found at the hepatic flexure. The polyp was       sessile. The polyp was removed with a cold biopsy forceps. Resection and       retrieval were complete.      A single non-bleeding linear thin erosion was found in the proximal       ascending colon. No stigmata of recent bleeding were seen.      A few small-mouthed diverticula were found in the sigmoid colon.      The exam was otherwise without abnormality. Impression:        - One diminutive polyp at the hepatic flexure. Resected         and retrieved.                    - A single erosion in the proximal ascending colon.                    - Diverticulosis in the sigmoid colon.                    -  The examination was otherwise normal. Recommendation:    - Await pathology results. Manya Silvas, MD 03/05/2015 10:09:36 AM This report has been signed electronically. Number of Addenda: 0 Note Initiated On: 03/05/2015 9:32 AM Scope Withdrawal Time: 0 hours 7 minutes 47 seconds  Total Procedure Duration: 0 hours 12 minutes 23 seconds       [facility]

## 2015-03-05 NOTE — Anesthesia Preprocedure Evaluation (Addendum)
Anesthesia Evaluation  Patient identified by MRN, date of birth, ID band Patient awake    Reviewed: Allergy & Precautions, NPO status , Patient's Chart, lab work & pertinent test results  Airway Mallampati: III  TM Distance: >3 FB Neck ROM: Limited    Dental  (+) Teeth Intact   Pulmonary sleep apnea and Continuous Positive Airway Pressure Ventilation , former smoker,    Pulmonary exam normal       Cardiovascular hypertension, Pt. on medications and Pt. on home beta blockers Normal cardiovascular exam    Neuro/Psych    GI/Hepatic   Endo/Other  diabetes, Poorly Controlled, Type 2BG 177.  Renal/GU      Musculoskeletal   Abdominal (+) + obese,  Abdomen: soft.    Peds  Hematology   Anesthesia Other Findings   Reproductive/Obstetrics                            Anesthesia Physical Anesthesia Plan  ASA: III  Anesthesia Plan: General   Post-op Pain Management:    Induction: Intravenous  Airway Management Planned: Nasal Cannula  Additional Equipment:   Intra-op Plan:   Post-operative Plan:   Informed Consent: I have reviewed the patients History and Physical, chart, labs and discussed the procedure including the risks, benefits and alternatives for the proposed anesthesia with the patient or authorized representative who has indicated his/her understanding and acceptance.     Plan Discussed with: CRNA  Anesthesia Plan Comments:         Anesthesia Quick Evaluation

## 2015-03-05 NOTE — H&P (Signed)
Primary Care Physician:  Ezequiel Kayser, MD Primary Gastroenterologist:  Dr. Vira Agar  Pre-Procedure History & Physical: HPI:  Crystal Frederick is a 74 y.o. female is here for an colonoscopy.   Past Medical History  Diagnosis Date  . Hypertension   . Diabetes mellitus without complication   . Obesity   . Peripheral neuropathy   . DVT (deep venous thrombosis)   . Peripheral neuropathy   . Multinodular goiter   . Pure hypercholesterolemia   . Gout   . Arthritis   . Cancer   . Peripheral vascular disease   . Sleep apnea   . Vitamin D deficiency   . Thrombocytopenia     Past Surgical History  Procedure Laterality Date  . Mastectomy    . Breast surgery    . Joint replacement    . Wisdom tooth extraction      Prior to Admission medications   Medication Sig Start Date End Date Taking? Authorizing Provider  acetaminophen (TYLENOL) 500 MG tablet Take 1,000 mg by mouth daily.   Yes Historical Provider, MD  albuterol (PROVENTIL HFA;VENTOLIN HFA) 108 (90 BASE) MCG/ACT inhaler Inhale 2 puffs into the lungs every 6 (six) hours as needed for wheezing or shortness of breath.   Yes Historical Provider, MD  Alpha-Lipoic Acid 200 MG TABS Take 200 mg by mouth daily.   Yes Historical Provider, MD  aspirin EC 81 MG tablet Take 81 mg by mouth daily.   Yes Historical Provider, MD  atorvastatin (LIPITOR) 20 MG tablet Take 20 mg by mouth daily.   Yes Historical Provider, MD  baclofen (LIORESAL) 10 MG tablet Take 10 mg by mouth 2 (two) times daily.   Yes Historical Provider, MD  bisoprolol (ZEBETA) 5 MG tablet Take 5 mg by mouth daily.   Yes Historical Provider, MD  calcium carbonate (OS-CAL) 600 MG TABS tablet Take 600 mg by mouth 2 (two) times daily with a meal.   Yes Historical Provider, MD  ergocalciferol (VITAMIN D2) 50000 UNITS capsule Take 50,000 Units by mouth once a week.   Yes Historical Provider, MD  fluticasone (FLONASE) 50 MCG/ACT nasal spray Place 2 sprays into both nostrils  daily.   Yes Historical Provider, MD  furosemide (LASIX) 40 MG tablet Take 40 mg by mouth daily.   Yes Historical Provider, MD  gabapentin (NEURONTIN) 300 MG capsule Take 300 mg by mouth 3 (three) times daily.   Yes Historical Provider, MD  glimepiride (AMARYL) 4 MG tablet Take 4 mg by mouth daily with breakfast.   Yes Historical Provider, MD  glucose blood test strip 1 each by Other route as needed for other. Use as instructed   Yes Historical Provider, MD  levothyroxine (SYNTHROID, LEVOTHROID) 50 MCG tablet Take 50 mcg by mouth daily before breakfast.   Yes Historical Provider, MD  losartan (COZAAR) 100 MG tablet Take 100 mg by mouth daily.   Yes Historical Provider, MD  metFORMIN (GLUCOPHAGE-XR) 500 MG 24 hr tablet Take 500 mg by mouth daily with breakfast.   Yes Historical Provider, MD  MULTIPLE VITAMIN-FOLIC ACID PO Take 1 tablet by mouth daily.   Yes Historical Provider, MD  omeprazole (PRILOSEC) 20 MG capsule Take 20 mg by mouth daily.   Yes Historical Provider, MD  silver sulfADIAZINE (SILVADENE) 1 % cream Apply 1 application topically daily.   Yes Historical Provider, MD  vitamin C (ASCORBIC ACID) 500 MG tablet Take 500 mg by mouth daily.   Yes Historical Provider, MD  anastrozole (ARIMIDEX)  1 MG tablet Take 1 tablet (1 mg total) by mouth daily. 01/01/15   Lequita Asal, MD    Allergies as of 02/06/2015  . (Not on File)    History reviewed. No pertinent family history.  History   Social History  . Marital Status: Single    Spouse Name: N/A  . Number of Children: N/A  . Years of Education: N/A   Occupational History  . Not on file.   Social History Main Topics  . Smoking status: Former Research scientist (life sciences)  . Smokeless tobacco: Never Used  . Alcohol Use: No  . Drug Use: No  . Sexual Activity: Not on file   Other Topics Concern  . Not on file   Social History Narrative    Review of Systems: See HPI, otherwise negative ROS  Physical Exam: BP 155/60 mmHg  Pulse 63   Temp(Src) 97.6 F (36.4 C) (Tympanic)  Resp 20  Ht 5\' 2"  (1.575 m)  Wt 90.719 kg (200 lb)  BMI 36.57 kg/m2  SpO2 100% General:   Alert,  pleasant and cooperative in NAD Head:  Normocephalic and atraumatic. Neck:  Supple; no masses or thyromegaly. Lungs:  Clear throughout to auscultation.    Heart:  Regular rate and rhythm. Abdomen:  Soft, nontender and nondistended. Normal bowel sounds, without guarding, and without rebound.   Neurologic:  Alert and  oriented x4;  grossly normal neurologically.  Impression/Plan: Crystal Frederick is here for an colonoscopy to be performed for screening colonoscopy  Risks, benefits, limitations, and alternatives regarding  colonoscopy have been reviewed with the patient.  Questions have been answered.  All parties agreeable.   Gaylyn Cheers, MD  03/05/2015, 10:14 AM   Primary Care Physician:  Ezequiel Kayser, MD Primary Gastroenterologist:  Dr. Vira Agar  Pre-Procedure History & Physical: HPI:  Crystal Frederick is a 74 y.o. female is here for an colonoscopy.   Past Medical History  Diagnosis Date  . Hypertension   . Diabetes mellitus without complication   . Obesity   . Peripheral neuropathy   . DVT (deep venous thrombosis)   . Peripheral neuropathy   . Multinodular goiter   . Pure hypercholesterolemia   . Gout   . Arthritis   . Cancer   . Peripheral vascular disease   . Sleep apnea   . Vitamin D deficiency   . Thrombocytopenia     Past Surgical History  Procedure Laterality Date  . Mastectomy    . Breast surgery    . Joint replacement    . Wisdom tooth extraction      Prior to Admission medications   Medication Sig Start Date End Date Taking? Authorizing Provider  acetaminophen (TYLENOL) 500 MG tablet Take 1,000 mg by mouth daily.   Yes Historical Provider, MD  albuterol (PROVENTIL HFA;VENTOLIN HFA) 108 (90 BASE) MCG/ACT inhaler Inhale 2 puffs into the lungs every 6 (six) hours as needed for wheezing or shortness of  breath.   Yes Historical Provider, MD  Alpha-Lipoic Acid 200 MG TABS Take 200 mg by mouth daily.   Yes Historical Provider, MD  aspirin EC 81 MG tablet Take 81 mg by mouth daily.   Yes Historical Provider, MD  atorvastatin (LIPITOR) 20 MG tablet Take 20 mg by mouth daily.   Yes Historical Provider, MD  baclofen (LIORESAL) 10 MG tablet Take 10 mg by mouth 2 (two) times daily.   Yes Historical Provider, MD  bisoprolol (ZEBETA) 5 MG tablet Take 5 mg by mouth daily.  Yes Historical Provider, MD  calcium carbonate (OS-CAL) 600 MG TABS tablet Take 600 mg by mouth 2 (two) times daily with a meal.   Yes Historical Provider, MD  ergocalciferol (VITAMIN D2) 50000 UNITS capsule Take 50,000 Units by mouth once a week.   Yes Historical Provider, MD  fluticasone (FLONASE) 50 MCG/ACT nasal spray Place 2 sprays into both nostrils daily.   Yes Historical Provider, MD  furosemide (LASIX) 40 MG tablet Take 40 mg by mouth daily.   Yes Historical Provider, MD  gabapentin (NEURONTIN) 300 MG capsule Take 300 mg by mouth 3 (three) times daily.   Yes Historical Provider, MD  glimepiride (AMARYL) 4 MG tablet Take 4 mg by mouth daily with breakfast.   Yes Historical Provider, MD  glucose blood test strip 1 each by Other route as needed for other. Use as instructed   Yes Historical Provider, MD  levothyroxine (SYNTHROID, LEVOTHROID) 50 MCG tablet Take 50 mcg by mouth daily before breakfast.   Yes Historical Provider, MD  losartan (COZAAR) 100 MG tablet Take 100 mg by mouth daily.   Yes Historical Provider, MD  metFORMIN (GLUCOPHAGE-XR) 500 MG 24 hr tablet Take 500 mg by mouth daily with breakfast.   Yes Historical Provider, MD  MULTIPLE VITAMIN-FOLIC ACID PO Take 1 tablet by mouth daily.   Yes Historical Provider, MD  omeprazole (PRILOSEC) 20 MG capsule Take 20 mg by mouth daily.   Yes Historical Provider, MD  silver sulfADIAZINE (SILVADENE) 1 % cream Apply 1 application topically daily.   Yes Historical Provider, MD   vitamin C (ASCORBIC ACID) 500 MG tablet Take 500 mg by mouth daily.   Yes Historical Provider, MD  anastrozole (ARIMIDEX) 1 MG tablet Take 1 tablet (1 mg total) by mouth daily. 01/01/15   Lequita Asal, MD    Allergies as of 02/06/2015  . (Not on File)    History reviewed. No pertinent family history.  History   Social History  . Marital Status: Single    Spouse Name: N/A  . Number of Children: N/A  . Years of Education: N/A   Occupational History  . Not on file.   Social History Main Topics  . Smoking status: Former Research scientist (life sciences)  . Smokeless tobacco: Never Used  . Alcohol Use: No  . Drug Use: No  . Sexual Activity: Not on file   Other Topics Concern  . Not on file   Social History Narrative    Review of Systems: See HPI, otherwise negative ROS  Physical Exam: BP 155/60 mmHg  Pulse 63  Temp(Src) 97.6 F (36.4 C) (Tympanic)  Resp 20  Ht 5\' 2"  (1.575 m)  Wt 90.719 kg (200 lb)  BMI 36.57 kg/m2  SpO2 100% General:   Alert,  pleasant and cooperative in NAD Head:  Normocephalic and atraumatic. Neck:  Supple; no masses or thyromegaly. Lungs:  Clear throughout to auscultation.    Heart:  Regular rate and rhythm. Abdomen:  Soft, nontender and nondistended. Normal bowel sounds, without guarding, and without rebound.   Neurologic:  Alert and  oriented x4;  grossly normal neurologically.  Impression/Plan: Crystal Frederick is here for an colonoscopy to be performed for screening colonoscopy  Risks, benefits, limitations, and alternatives regarding  colonoscopy have been reviewed with the patient.  Questions have been answered.  All parties agreeable.   Gaylyn Cheers, MD  03/05/2015, 10:14 AM

## 2015-03-05 NOTE — Anesthesia Postprocedure Evaluation (Signed)
  Anesthesia Post-op Note  Patient: Crystal Frederick  Procedure(s) Performed: Procedure(s): COLONOSCOPY WITH PROPOFOL (N/A)  Anesthesia type:General  Patient location: PACU  Post pain: Pain level controlled  Post assessment: Post-op Vital signs reviewed, Patient's Cardiovascular Status Stable, Respiratory Function Stable, Patent Airway and No signs of Nausea or vomiting  Post vital signs: Reviewed and stable  Last Vitals:  Filed Vitals:   03/05/15 1043  BP: 137/99  Pulse: 68  Temp:   Resp: 17    Level of consciousness: awake, alert  and patient cooperative  Complications: No apparent anesthesia complications

## 2015-03-07 LAB — SURGICAL PATHOLOGY

## 2015-03-12 ENCOUNTER — Encounter: Payer: Self-pay | Admitting: Unknown Physician Specialty

## 2015-05-19 ENCOUNTER — Other Ambulatory Visit: Payer: Self-pay | Admitting: Hematology and Oncology

## 2015-05-21 ENCOUNTER — Telehealth: Payer: Self-pay | Admitting: *Deleted

## 2015-05-21 ENCOUNTER — Other Ambulatory Visit: Payer: Self-pay

## 2015-05-21 DIAGNOSIS — C50911 Malignant neoplasm of unspecified site of right female breast: Secondary | ICD-10-CM

## 2015-05-21 DIAGNOSIS — C50912 Malignant neoplasm of unspecified site of left female breast: Principal | ICD-10-CM

## 2015-05-21 NOTE — Telephone Encounter (Signed)
Done 10/2

## 2015-05-23 ENCOUNTER — Inpatient Hospital Stay: Payer: Medicare Other | Attending: Hematology and Oncology

## 2015-05-23 ENCOUNTER — Inpatient Hospital Stay (HOSPITAL_BASED_OUTPATIENT_CLINIC_OR_DEPARTMENT_OTHER): Payer: Medicare Other | Admitting: Hematology and Oncology

## 2015-05-23 ENCOUNTER — Encounter: Payer: Self-pay | Admitting: Hematology and Oncology

## 2015-05-23 VITALS — BP 113/54 | HR 55 | Temp 95.6°F | Resp 17 | Ht 62.0 in | Wt 189.5 lb

## 2015-05-23 DIAGNOSIS — Z9221 Personal history of antineoplastic chemotherapy: Secondary | ICD-10-CM | POA: Insufficient documentation

## 2015-05-23 DIAGNOSIS — Z9013 Acquired absence of bilateral breasts and nipples: Secondary | ICD-10-CM

## 2015-05-23 DIAGNOSIS — G629 Polyneuropathy, unspecified: Secondary | ICD-10-CM | POA: Insufficient documentation

## 2015-05-23 DIAGNOSIS — E119 Type 2 diabetes mellitus without complications: Secondary | ICD-10-CM

## 2015-05-23 DIAGNOSIS — Z79811 Long term (current) use of aromatase inhibitors: Secondary | ICD-10-CM

## 2015-05-23 DIAGNOSIS — H578 Other specified disorders of eye and adnexa: Secondary | ICD-10-CM | POA: Insufficient documentation

## 2015-05-23 DIAGNOSIS — Z803 Family history of malignant neoplasm of breast: Secondary | ICD-10-CM | POA: Diagnosis not present

## 2015-05-23 DIAGNOSIS — C50912 Malignant neoplasm of unspecified site of left female breast: Secondary | ICD-10-CM | POA: Diagnosis not present

## 2015-05-23 DIAGNOSIS — E7800 Pure hypercholesterolemia, unspecified: Secondary | ICD-10-CM | POA: Diagnosis not present

## 2015-05-23 DIAGNOSIS — Z853 Personal history of malignant neoplasm of breast: Secondary | ICD-10-CM | POA: Diagnosis not present

## 2015-05-23 DIAGNOSIS — R2689 Other abnormalities of gait and mobility: Secondary | ICD-10-CM | POA: Diagnosis not present

## 2015-05-23 DIAGNOSIS — Z87891 Personal history of nicotine dependence: Secondary | ICD-10-CM | POA: Insufficient documentation

## 2015-05-23 DIAGNOSIS — Z86718 Personal history of other venous thrombosis and embolism: Secondary | ICD-10-CM | POA: Diagnosis not present

## 2015-05-23 DIAGNOSIS — M1990 Unspecified osteoarthritis, unspecified site: Secondary | ICD-10-CM | POA: Insufficient documentation

## 2015-05-23 DIAGNOSIS — I10 Essential (primary) hypertension: Secondary | ICD-10-CM | POA: Diagnosis not present

## 2015-05-23 DIAGNOSIS — G4730 Sleep apnea, unspecified: Secondary | ICD-10-CM | POA: Insufficient documentation

## 2015-05-23 DIAGNOSIS — C50911 Malignant neoplasm of unspecified site of right female breast: Secondary | ICD-10-CM

## 2015-05-23 DIAGNOSIS — Z170 Estrogen receptor positive status [ER+]: Secondary | ICD-10-CM | POA: Insufficient documentation

## 2015-05-23 DIAGNOSIS — Z923 Personal history of irradiation: Secondary | ICD-10-CM

## 2015-05-23 DIAGNOSIS — R51 Headache: Secondary | ICD-10-CM

## 2015-05-23 LAB — COMPREHENSIVE METABOLIC PANEL
ALT: 13 U/L — ABNORMAL LOW (ref 14–54)
AST: 17 U/L (ref 15–41)
Albumin: 3.7 g/dL (ref 3.5–5.0)
Alkaline Phosphatase: 68 U/L (ref 38–126)
Anion gap: 7 (ref 5–15)
BUN: 27 mg/dL — ABNORMAL HIGH (ref 6–20)
CO2: 29 mmol/L (ref 22–32)
Calcium: 9.2 mg/dL (ref 8.9–10.3)
Chloride: 106 mmol/L (ref 101–111)
Creatinine, Ser: 1.05 mg/dL — ABNORMAL HIGH (ref 0.44–1.00)
GFR calc Af Amer: 58 mL/min — ABNORMAL LOW (ref >60)
GFR calc non Af Amer: 50 mL/min — ABNORMAL LOW (ref >60)
Glucose, Bld: 83 mg/dL (ref 65–99)
Potassium: 4.6 mmol/L (ref 3.5–5.1)
Sodium: 142 mmol/L (ref 135–145)
Total Bilirubin: 0.6 mg/dL (ref 0.3–1.2)
Total Protein: 7.1 g/dL (ref 6.5–8.1)

## 2015-05-23 LAB — CBC WITH DIFFERENTIAL/PLATELET
Basophils Absolute: 0.1 10*3/uL (ref 0–0.1)
Basophils Relative: 1 %
Eosinophils Absolute: 0.4 10*3/uL (ref 0–0.7)
Eosinophils Relative: 5 %
HCT: 34.7 % — ABNORMAL LOW (ref 35.0–47.0)
Hemoglobin: 11.4 g/dL — ABNORMAL LOW (ref 12.0–16.0)
Lymphocytes Relative: 17 %
Lymphs Abs: 1.3 10*3/uL (ref 1.0–3.6)
MCH: 29.2 pg (ref 26.0–34.0)
MCHC: 32.9 g/dL (ref 32.0–36.0)
MCV: 88.8 fL (ref 80.0–100.0)
Monocytes Absolute: 0.7 10*3/uL (ref 0.2–0.9)
Monocytes Relative: 9 %
Neutro Abs: 5.3 10*3/uL (ref 1.4–6.5)
Neutrophils Relative %: 68 %
Platelets: 150 10*3/uL (ref 150–440)
RBC: 3.9 MIL/uL (ref 3.80–5.20)
RDW: 15 % — ABNORMAL HIGH (ref 11.5–14.5)
WBC: 7.6 10*3/uL (ref 3.6–11.0)

## 2015-05-23 NOTE — Progress Notes (Signed)
Here for breast cancer follow up.  Only reports medication changes.  Reports the medication Januvia makes her feel crazy and will be talking with pcp with this concern.

## 2015-05-23 NOTE — Progress Notes (Signed)
[facility] day:  05/23/2015  Chief Complaint: Crystal Frederick is a 74 y.o. female with a history of bilateral breast cancer who is seen for 6 month assessment on Arimidex.  HPI: The patient was last seen in the medical oncology clinic on 12/13/2014.  At that time, she denied any complaint. Exam was unremarkable. CA 2729 was 39.1 on 11/22/2014. The significance was unclear. We discussed continuation of Arimidex. Repeat CA 27-29 on 01/24/2015 was 35.5 (normal).  During the interim, states that she has felt "so-so". She notes having problems with Januvia, her diabetic medication. She states that sometimes she feels "like I am drunk as it is hard to walk".  She denies any breast concerns.  Past Medical History  Diagnosis Date  . Hypertension   . Diabetes mellitus without complication ([place])   . Obesity   . Peripheral neuropathy ([place])   . DVT (deep venous thrombosis) ([place])   . Peripheral neuropathy ([place])   . Multinodular goiter   . Pure hypercholesterolemia   . Gout   . Arthritis   . Cancer ([place])   . Peripheral vascular disease ([place])   . Sleep apnea   . Vitamin D deficiency   . Thrombocytopenia [facility])     Past Surgical History  Procedure Laterality Date  . Mastectomy    . Breast surgery    . Joint replacement    . Wisdom tooth extraction    . Colonoscopy with propofol N/A 03/05/2015    Procedure: COLONOSCOPY WITH PROPOFOL;  Surgeon: Manya Silvas, MD;  Location: [facility] ENDOSCOPY;  Service: Endoscopy;  Laterality: N/A;    Family History  Problem Relation Age of Onset  . Hypertension Mother   . Cancer Mother     Breast  . Cancer Father     Leukemia    Social History:  reports that she has quit smoking. She has never used smokeless tobacco. She reports that she does not drink alcohol or use illicit drugs.  The patient is alone today.  Allergies:  Allergies  Allergen Reactions  . Celebrex [Celecoxib] Other (See Comments)  .  Metformin And Related Diarrhea  . Tramadol Nausea And Vomiting  . Voltaren [Diclofenac Sodium] Nausea And Vomiting    Current Medications: Current Outpatient Prescriptions  Medication Sig Dispense Refill  . acetaminophen (TYLENOL) 500 MG tablet Take 1,000 mg by mouth daily.    Marland Kitchen albuterol (PROVENTIL HFA;VENTOLIN HFA) 108 (90 BASE) MCG/ACT inhaler Inhale 2 puffs into the lungs every 6 (six) hours as needed for wheezing or shortness of breath.    . Alpha-Lipoic Acid 200 MG TABS Take 200 mg by mouth daily.    Marland Kitchen anastrozole (ARIMIDEX) 1 MG tablet TAKE ONE TABLET BY MOUTH ONCE DAILY 30 tablet 0  . aspirin EC 81 MG tablet Take 81 mg by mouth daily.    Marland Kitchen atorvastatin (LIPITOR) 20 MG tablet Take 20 mg by mouth daily.    . baclofen (LIORESAL) 10 MG tablet Take 10 mg by mouth 2 (two) times daily.    . bisoprolol (ZEBETA) 5 MG tablet Take 5 mg by mouth daily.    . calcium carbonate (OS-CAL) 600 MG TABS tablet Take 600 mg by mouth 2 (two) times daily with a meal.    . ergocalciferol (VITAMIN D2) 50000 UNITS capsule Take 50,000 Units by mouth once a week.    . fluticasone (FLONASE) 50 MCG/ACT nasal spray Place 2 sprays into both nostrils daily.    Marland Kitchen  furosemide (LASIX) 40 MG tablet Take 40 mg by mouth daily.    Marland Kitchen gabapentin (NEURONTIN) 300 MG capsule Take 300 mg by mouth 3 (three) times daily.    Marland Kitchen glimepiride (AMARYL) 4 MG tablet Take 4 mg by mouth daily with breakfast.    . glucose blood test strip 1 each by Other route as needed for other. Use as instructed    . levothyroxine (SYNTHROID, LEVOTHROID) 50 MCG tablet Take 50 mcg by mouth daily before breakfast.    . losartan (COZAAR) 100 MG tablet Take 100 mg by mouth daily.    . metFORMIN (GLUCOPHAGE-XR) 500 MG 24 hr tablet Take 500 mg by mouth daily with breakfast.    . MULTIPLE VITAMIN-FOLIC ACID PO Take 1 tablet by mouth daily.    Marland Kitchen omeprazole (PRILOSEC) 20 MG capsule Take 20 mg by mouth daily.    . sitaGLIPtin (JANUVIA) 100 MG tablet Take by mouth.     . vitamin C (ASCORBIC ACID) 500 MG tablet Take 500 mg by mouth daily.     No current facility-administered medications for this visit.    Review of Systems:  GENERAL:  Feels "so so".  No fevers, sweats or weight loss. PERFORMANCE STATUS (ECOG):  1 HEENT:  No visual changes, runny nose, sore throat, mouth sores or tenderness. Lungs: No shortness of breath or cough.  No hemoptysis. Cardiac:  No chest pain, palpitations, orthopnea, or PND. GI:  No nausea, vomiting, diarrhea, constipation, melena or hematochezia. GU:  No urgency, frequency, dysuria, or hematuria. Musculoskeletal:  No back pain.  No joint pain.  No muscle tenderness. Extremities:  No pain or swelling. Skin:  No rashes or skin changes. Neuro:  No headache, numbness or weakness, balance or coordination issues. Endocrine:  Diabetes.  Issues with Januvia.  No thyroid issues, hot flashes or night sweats. Psych:  No mood changes, depression or anxiety. Pain:  No focal pain. Review of systems:  All other systems reviewed and found to be negative.  Physical Exam: Blood pressure 113/54, pulse 55, temperature 95.6 F (35.3 C), temperature source Tympanic, resp. rate 17, height _0  (1.575 m), weight 189 lb 7.8 oz (85.95 kg). GENERAL:  Well developed, well nourished, sitting comfortably in the exam room in no acute distress.  She has a cane at her side. MENTAL STATUS:  Alert and oriented to person, place and time. HEAD:  Short dark hair.  Normocephalic, atraumatic, face symmetric, no Cushingoid features. EYES:  Brown eyes.  Pupils equal round and reactive to light and accomodation.  No conjunctivitis or scleral icterus. ENT:  Oropharynx clear without lesion.  Tongue normal. Mucous membranes moist.  RESPIRATORY:  Clear to auscultation without rales, wheezes or rhonchi. CARDIOVASCULAR:  Regular rate and rhythm without murmur, rub or gallop. BREAST:  Right mastectomy with well healed incision without erythema or nodularity.  Left  chest with significant post-op changes and indentation at 2 o'clock position. No discrete masses, skin changes or nipple discharge.  ABDOMEN:  Soft, non-tender, with active bowel sounds, and no hepatosplenomegaly.  No masses. SKIN:  No rashes, ulcers or lesions. EXTREMITIES: No edema, no skin discoloration or tenderness.  No palpable cords. LYMPH NODES: No palpable cervical, supraclavicular, axillary or inguinal adenopathy  NEUROLOGICAL: Unremarkable. PSYCH:  Appropriate.  Appointment on 05/23/2015  Component Date Value Ref Range Status  . WBC 05/23/2015 7.6  3.6 - 11.0 K/uL Final  . RBC 05/23/2015 3.90  3.80 - 5.20 MIL/uL Final  . Hemoglobin 05/23/2015 11.4* 12.0 - 16.0 g/dL  Final  . HCT 05/23/2015 34.7* 35.0 - 47.0 % Final  . MCV 05/23/2015 88.8  80.0 - 100.0 fL Final  . MCH 05/23/2015 29.2  26.0 - 34.0 pg Final  . MCHC 05/23/2015 32.9  32.0 - 36.0 g/dL Final  . RDW 05/23/2015 15.0* 11.5 - 14.5 % Final  . Platelets 05/23/2015 150  150 - 440 K/uL Final  . Neutrophils Relative % 05/23/2015 68   Final  . Neutro Abs 05/23/2015 5.3  1.4 - 6.5 K/uL Final  . Lymphocytes Relative 05/23/2015 17   Final  . Lymphs Abs 05/23/2015 1.3  1.0 - 3.6 K/uL Final  . Monocytes Relative 05/23/2015 9   Final  . Monocytes Absolute 05/23/2015 0.7  0.2 - 0.9 K/uL Final  . Eosinophils Relative 05/23/2015 5   Final  . Eosinophils Absolute 05/23/2015 0.4  0 - 0.7 K/uL Final  . Basophils Relative 05/23/2015 1   Final  . Basophils Absolute 05/23/2015 0.1  0 - 0.1 K/uL Final  . Sodium 05/23/2015 142  135 - 145 mmol/L Final  . Potassium 05/23/2015 4.6  3.5 - 5.1 mmol/L Final  . Chloride 05/23/2015 106  101 - 111 mmol/L Final  . CO2 05/23/2015 29  22 - 32 mmol/L Final  . Glucose, Bld 05/23/2015 83  65 - 99 mg/dL Final  . BUN 05/23/2015 27* 6 - 20 mg/dL Final  . Creatinine, Ser 05/23/2015 1.05* 0.44 - 1.00 mg/dL Final  . Calcium 05/23/2015 9.2  8.9 - 10.3 mg/dL Final  . Total Protein 05/23/2015 7.1  6.5 - 8.1  g/dL Final  . Albumin 05/23/2015 3.7  3.5 - 5.0 g/dL Final  . AST 05/23/2015 17  15 - 41 U/L Final  . ALT 05/23/2015 13* 14 - 54 U/L Final  . Alkaline Phosphatase 05/23/2015 68  38 - 126 U/L Final  . Total Bilirubin 05/23/2015 0.6  0.3 - 1.2 mg/dL Final  . GFR calc non Af Amer 05/23/2015 50* >60 mL/min Final  . GFR calc Af Amer 05/23/2015 58* >60 mL/min Final   Comment: (NOTE) The eGFR has been calculated using the CKD EPI equation. This calculation has not been validated in all clinical situations. eGFR's persistently <60 mL/min signify possible Chronic Kidney Disease.   . Anion gap 05/23/2015 7  5 - 15 Final    Assessment:  Crystal Frederick is a 74 y.o. 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  She presented in 04/1998 with a 4 x 2.2 x 2.2 cm right breast cancer.  She underwent lumpectomy.  Tumor was ER, PR, and Her2/neu negative.  She received adjuvant adriamycin and Cytoxan (AC) followed by Taxol.  She received radiation.  She developed recurrent disease in 12/2000.  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  She underwent modified radical mastectomy.  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  She developed left breast cancer in 11/2009.  She underwent mastectomy in 12/2009.  Tumor was ER positive, PR positive, and  Her2/neu negative.  Oncotype score was 33 (high).  She received 6 cycles of Taxotere and Cytoxan (TC) from 01/29/2010 - 05/15/2010.  She completed left chest wall radiation in 07/2010.  Arimidex started in 07/2010.  She is tolerating it well.  CA 27-29 was 35.5 (normal) on 01/24/2015.  She fell in 10/2013 and sustained an orbital fracture.  CT face revealed a subpleural density in both  upper lobes felt secondary to scarring.  She is being followed by Dr. Raul Del of pulmonary medicine.   She had an intermittent headache and a pressure  sensation on the right side of her head and balance problem for 4-5 months.  Head MRI on 11/30/2014 revealed no metastatic disease. She had small areas of hemosiderin both anterior frontal lobes and also the right posterior temporal lobe sequela of trauma. There was chronic left sided MCA infarct. Cervical spine MRI on 11/30/2014 revealed subtle signal changes along the ventral C4 and C5-C6 level suspicious for posterior anterior ligamentous injury. There was widespread cervical spine disc and endplate degeneration. There was no evidence of metastatic disease.  Symptomatically, she denies any new complaints.  Exam reveals post-operative changes.  Plan: 1. Labs today:  CBC with diff, CMP, CA27.29. 2. Refill Arimidex 3. Bone density study. 4. Patient to see dentist. 5. RTC after bone density.   Lequita Asal, MD  05/23/2015, 11:41 AM

## 2015-05-24 LAB — CANCER ANTIGEN 27.29: CA 27.29: 30.9 U/mL (ref 0.0–38.6)

## 2015-06-04 DIAGNOSIS — M810 Age-related osteoporosis without current pathological fracture: Secondary | ICD-10-CM | POA: Insufficient documentation

## 2015-06-05 ENCOUNTER — Ambulatory Visit: Payer: Medicare Other | Attending: Neurology

## 2015-06-05 DIAGNOSIS — R2681 Unsteadiness on feet: Secondary | ICD-10-CM | POA: Diagnosis not present

## 2015-06-05 DIAGNOSIS — R531 Weakness: Secondary | ICD-10-CM | POA: Insufficient documentation

## 2015-06-05 NOTE — Therapy (Signed)
[place] MAIN [facility] SERVICES [address] [place], [place], [address] Phone: [phone]   Fax:  [phone]  Physical Therapy Evaluation  Patient Details  Name: Crystal Frederick MRN: [id number] Date of Birth: 1941-08-16 Referring Provider: Dr. Brigitte Pulse  Encounter Date: 06/05/2015      PT End of Session - 06/05/15 1415    Visit Number 1   Number of Visits 9   Date for PT Re-Evaluation 07/03/15   Authorization Type 1/10 G codes   PT Start Time 1015   PT Stop Time 1115   PT Time Calculation (min) 60 min   Equipment Utilized During Treatment Gait belt   Activity Tolerance Patient tolerated treatment well   Behavior During Therapy [facility] for tasks assessed/performed      Past Medical History  Diagnosis Date  . Hypertension   . Diabetes mellitus without complication ([place])   . Obesity   . Peripheral neuropathy ([place])   . DVT (deep venous thrombosis) ([place])   . Peripheral neuropathy ([place])   . Multinodular goiter   . Pure hypercholesterolemia   . Gout   . Arthritis   . Cancer ([place])   . Peripheral vascular disease ([place])   . Sleep apnea   . Vitamin D deficiency   . Thrombocytopenia [facility])     Past Surgical History  Procedure Laterality Date  . Mastectomy    . Breast surgery    . Joint replacement    . Wisdom tooth extraction    . Colonoscopy with propofol N/A 03/05/2015    Procedure: COLONOSCOPY WITH PROPOFOL;  Surgeon: Manya Silvas, MD;  Location: [facility] ENDOSCOPY;  Service: Endoscopy;  Laterality: N/A;    There were no vitals filed for this visit.  Visit Diagnosis:  Unsteadiness on feet - Plan: PT plan of care cert/re-cert  Weakness - Plan: PT plan of care cert/re-cert      Subjective Assessment - 06/05/15 1411    Subjective Pt relates she had a recent change in medication (August) when started feeling unwell, weak and had a "funny in the head."  She was not able to maneuver and it took her two days to return to [facility] which  happened 3x that month. Since she started taking her medication in the afternoons and cutting the dose she feels fine and back to "normal."  pt reports she is here because her MD referred her for balance.  pt reports she had imaging which revealed had mini strokes but is doing well now.  Pt does not recall experiencing any symptoms of CVAs.  Pt has left knee pain 2/10 and experiences right knee pain when she walks.   pt has fallen once in the past month, in the bathroom when she lost her balance.  Pt notes increased unsteadiness when she is tired or "sleepy." Pt does have a cane and walker but only uses her cane occasionally.     Patient Stated Goals Pt would like to walk without falling down.     Currently in Pain? Yes   Pain Score 2    Pain Location Knee   Pain Orientation Left            [facility] PT Assessment - 06/05/15 1014    Assessment   Medical Diagnosis Imbalance   Referring Provider Dr. Brigitte Pulse   Onset Date/Surgical Date 04/02/15   Hand Dominance Right   Prior Therapy none   Precautions   Precautions Fall   Precaution Comments pt has fallen 3  times in the past year   Restrictions   Weight Bearing Restrictions No   Balance Screen   Has the patient fallen in the past 6 months Yes   How many times? 1   Has the patient had a decrease in activity level because of a fear of falling?  No   Is the patient reluctant to leave their home because of a fear of falling?  No   Home Ecologist residence   Living Arrangements Other relatives   Available Help at Discharge Family   Prior Function   Level of [place] Retired   Associate Professor   Overall Cognitive Status Within Functional Limits for tasks assessed   Sensation   Light Touch Appears Intact   Stereognosis Appears Intact   Proprioception Appears Intact   Coordination   Gross Motor Movements are Fluid and Coordinated Yes   Finger Nose Finger Test WNL   Heel Shin Test WNL        AROM: Bilateral LEs WFL   STRENGTH: Graded on a 0-5 scale Muscle Group Left Right  Hip Flex 4+ 4+  Hip Abd    Hip Add    Hip Ext    Knee ext 5 5  Knee Flex 4+ 4+  Ankle in/ev 5 5  Ankle DF 5 5  Ankle PF     SENSATION: LEs WNL      BALANCE: Pt displays decreased dynamic balance, especially during single leg phase   GAIT: Pt ambulates without assistive device  has short stride length, minimal trunk rotation and arm swing and with wide base of support   OUTCOME MEASURES: TEST Outcome Interpretation  5 times sit<>stand 16.62 sec without UE assist  >20 yo, >15 sec indicates increased risk for falls  10 meter walk test  0.72 m/s <1.0 m/s indicates increased risk for falls; limited community ambulator  Timed up and Go 14.96 sec  <14 sec indicates increased risk for falls  6 minute walk test  Feet 1000 feet is community Water quality scientist  49/56 <36/56 (100% risk for falls), 37-45 (80% risk for falls); 46-51 (>50% risk for falls); 52-55 (lower risk <25% of falls)                                PT Education - 06/05/15 1414    Education provided Yes   Education Details plan of care, outcome measures    Person(s) Educated Patient   Methods Explanation   Comprehension Verbalized understanding             PT Long Term Goals - 06/05/15 1423    PT LONG TERM GOAL #1   Title pt's Berg balance score will improve by at least 6 points indicating decreased fall risk    Baseline 49/56 on eval (10/18)   Time 4   Period Weeks   Status New   PT LONG TERM GOAL #2   Title pt's 5x sit to stand will be less than 11 sec for average time for her age indicating improved functional LE    Baseline 16.62 sec on eval (10/18)   Time 4   Period Weeks   Status New   PT LONG TERM GOAL #3   Title pt's gait speed will be greater than 0.8 m/s with LRAD for safe  community ambulation    Baseline 0.72 m/s on eval (10/18) with  no assistive device               Plan - 06-12-2015 1421    Clinical Impression Statement pt is a pleasant 74 year old female with chief complaint of unsteadiness and history of falls. pt's MRI revealed she has chronic left MCA infarct, small lacunar infarcts in the cerebellum.   Based on her evaluation today, she is at risk for falls and has decreased LE functional strength.  Pt displays decreased dynamic balance versus static especially during single leg stance.  Pt would benefit from skilled PT services to improve her impairments, decrease her risk of falls and improve functional activity tolerance.     Pt will benefit from skilled therapeutic intervention in order to improve on the following deficits Decreased strength;Postural dysfunction;Decreased balance;Decreased activity tolerance   Rehab Potential Fair   PT Frequency 2x / week   PT Duration 4 weeks   PT Treatment/Interventions Biofeedback;Cryotherapy;Electrical Stimulation;Moist Heat;Therapeutic exercise;Therapeutic activities;Functional mobility training;Stair training;Gait training;Balance training;Neuromuscular re-education;Manual techniques;Vestibular          G-Codes - 2015-06-12 1431    Functional Assessment Tool Used clinical judgment, outcome measures, history    Functional Limitation Mobility: Walking and moving around   Mobility: Walking and Moving Around Current Status [phone]) At least 20 percent but less than 40 percent impaired, limited or restricted   Mobility: Walking and Moving Around Goal Status [phone]) At least 1 percent but less than 20 percent impaired, limited or restricted       Problem List Patient Active Problem List   Diagnosis Date Noted  . Bilateral breast cancer ([place]) 05/23/2015  . Headache, tension-type 02/23/2015  . Lacunar infarction ([place]) 12/21/2014  . Hay fever 11/24/2014  . Other long term (current) drug therapy 11/24/2014   . Avitaminosis D 05/20/2014  . Arthritis of knee, degenerative 05/20/2014  . 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 Type 2 diabetes mellitus ([place]) 04/13/2014  . Gout 04/13/2014  . 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 Obstructive apnea 12/29/2013  . Adiposity 12/29/2013  . Absolute anemia 11/26/2013  . Benign essential HTN 11/26/2013  . History of artificial joint 11/26/2013  . Pure hypercholesterolemia 11/26/2013  . Disease of nail 11/26/2013   Renford Dills, SPT This entire session was performed under direct supervision and direction of a licensed therapist/therapist assistant . I have personally read, edited and approve of the note as written. Gorden Harms. Tortorici, PT, DPT [phone]  Tortorici,Ashley 2015/06/12, 4:24 PM  [place] MAIN [facility] SERVICES [address] [place], [place], [address] Phone: [phone]   Fax:  [phone]  Name: Marielle Mantione MRN: [id number] Date of Birth: 1941-07-08

## 2015-06-07 ENCOUNTER — Ambulatory Visit: Payer: Medicare Other

## 2015-06-12 ENCOUNTER — Ambulatory Visit: Payer: Medicare Other

## 2015-06-12 DIAGNOSIS — R2681 Unsteadiness on feet: Secondary | ICD-10-CM

## 2015-06-12 DIAGNOSIS — R531 Weakness: Secondary | ICD-10-CM

## 2015-06-12 NOTE — Patient Instructions (Signed)
[email or web address] Sit to stand 2x10 Bridges 2x10 SLR 2x10, each LE Standing hip abduction with red band 2x10 each LE

## 2015-06-12 NOTE — Therapy (Signed)
[place] MAIN [facility] SERVICES [address] [place], [place], [address] Phone: [phone]   Fax:  [phone]  Physical Therapy Treatment  Patient Details  Name: Crystal Frederick MRN: [id number] Date of Birth: 07-22-1941 Referring Provider: Dr. Brigitte Pulse  Encounter Date: 06/12/2015      PT End of Session - 06/12/15 1158    Visit Number 2   Number of Visits 9   Date for PT Re-Evaluation 07/03/15   Authorization Type 10-25-2022 G codes   PT Start Time 1000   PT Stop Time 1045   PT Time Calculation (min) 45 min   Equipment Utilized During Treatment Gait belt   Activity Tolerance Patient tolerated treatment well   Behavior During Therapy [facility] for tasks assessed/performed      Past Medical History  Diagnosis Date  . Hypertension   . Diabetes mellitus without complication ([place])   . Obesity   . Peripheral neuropathy ([place])   . DVT (deep venous thrombosis) ([place])   . Peripheral neuropathy ([place])   . Multinodular goiter   . Pure hypercholesterolemia   . Gout   . Arthritis   . Cancer ([place])   . Peripheral vascular disease ([place])   . Sleep apnea   . Vitamin D deficiency   . Thrombocytopenia [facility])     Past Surgical History  Procedure Laterality Date  . Mastectomy    . Breast surgery    . Joint replacement    . Wisdom tooth extraction    . Colonoscopy with propofol N/A 03/05/2015    Procedure: COLONOSCOPY WITH PROPOFOL;  Surgeon: Manya Silvas, MD;  Location: [facility] ENDOSCOPY;  Service: Endoscopy;  Laterality: N/A;    There were no vitals filed for this visit.  Visit Diagnosis:  Unsteadiness on feet  Weakness      Subjective Assessment - 06/12/15 1107    Subjective Pt reports she was diagnosed with osteoporosis recently and is concerned she might break a hip in the future if she falls.  Pt denies any pain currently or any near falls/falls since her last visit.     Patient Stated Goals Pt would like to walk without falling down.     Currently in Pain? No/denies   Pain Score 0-No pain      There ex: Nustep level 4 x 4 min.   Leg press with 75 # x10, pt required cueing to perform eccentric phase at a slower pace to improve control Leg press with 90# x10 SLR 2x10, pt required verbal cueing to maintain LEs straight to prevent knee flexion  Bridge 2x10, pt required verbal cueing for gluteal activation Hip abduction with red band above knees 2x10 each LE Mini squats on AIREX 2X10, pt required verbal cueing to improve hip hinge Sit to stand 2x10, pt required verbal and visual cueing to prevent using swaying backwards and using momentum to stand up.                             PT Education - 06/12/15 1158    Education provided Yes   Education Details plan of care, LE strengthening and HEP   Person(s) Educated Patient   Methods Explanation   Comprehension Verbalized understanding             PT Long Term Goals - 06/05/15 1423    PT LONG TERM GOAL #1   Title pt's Berg balance score will improve by at least 6  points indicating decreased fall risk    Baseline 49/56 on eval (10/18)   Time 4   Period Weeks   Status New   PT LONG TERM GOAL #2   Title pt's 5x sit to stand will be less than 11 sec for average time for her age indicating improved functional LE    Baseline 16.62 sec on eval (10/18)   Time 4   Period Weeks   Status New   PT LONG TERM GOAL #3   Title pt's gait speed will be greater than 0.8 m/s with LRAD for safe community ambulation    Baseline 0.72 m/s on eval (10/18) with no assistive device               Plan - 06/12/15 1202    Clinical Impression Statement Pt did really well today with strength progression and HEP instruction.  Pt required few rest breaks due to left knee pain, which resolved quickly.  Pt did not experience any LOB while she ambulated around the gym.     Pt will benefit from skilled therapeutic intervention in order to improve on the following  deficits Decreased strength;Postural dysfunction;Decreased balance;Decreased activity tolerance   Rehab Potential Fair   PT Frequency 2x / week   PT Duration 4 weeks   PT Treatment/Interventions Biofeedback;Cryotherapy;Electrical Stimulation;Moist Heat;Therapeutic exercise;Therapeutic activities;Functional mobility training;Stair training;Gait training;Balance training;Neuromuscular re-education;Manual techniques;Vestibular   PT Next Visit Plan HEP progressoin         Problem List Patient Active Problem List   Diagnosis Date Noted  . Bilateral breast cancer ([place]) 05/23/2015  . Headache, tension-type 02/23/2015  . Lacunar infarction ([place]) 12/21/2014  . Hay fever 11/24/2014  . Other long term (current) drug therapy 11/24/2014  . Avitaminosis D 05/20/2014  . Arthritis of knee, degenerative 05/20/2014  . Chronic painful diabetic neuropathy ([place]) 05/20/2014  . Type 2 diabetes mellitus ([place]) 04/13/2014  . Gout 04/13/2014  . 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 Obstructive apnea 12/29/2013  . Adiposity 12/29/2013  . Absolute anemia 11/26/2013  . Benign essential HTN 11/26/2013  . History of artificial joint 11/26/2013  . Pure hypercholesterolemia 11/26/2013  . Disease of nail 11/26/2013   Renford Dills, SPT This entire session was performed under direct supervision and direction of a licensed therapist/therapist assistant . I have personally read, edited and approve of the note as written.  Gorden Harms. Tortorici, PT, DPT [phone]   Tortorici,Ashley 06/12/2015, 2:40 PM  [place] MAIN [facility] SERVICES [address] [place], [place], [address] Phone: [phone]   Fax:  [phone]  Name: Crystal Frederick MRN: [id number] Date of Birth: 1941-06-10

## 2015-06-14 ENCOUNTER — Ambulatory Visit: Payer: Medicare Other

## 2015-06-18 ENCOUNTER — Ambulatory Visit: Payer: Medicare Other

## 2015-06-18 DIAGNOSIS — R2681 Unsteadiness on feet: Secondary | ICD-10-CM

## 2015-06-18 DIAGNOSIS — R531 Weakness: Secondary | ICD-10-CM

## 2015-06-18 NOTE — Therapy (Signed)
[place] MAIN [facility] SERVICES [address] [place], [place], [address] Phone: [phone]   Fax:  [phone]  Physical Therapy Treatment  Patient Details  Name: Crystal Frederick MRN: [id number] Date of Birth: 1940/08/20 Referring Provider: Dr. Brigitte Pulse  Encounter Date: 06/18/2015      PT End of Session - 06/18/15 1544    Visit Number 3   Number of Visits 9   Date for PT Re-Evaluation 07/03/15   Authorization Type 11-16-2022 G codes   PT Start Time 6962   PT Stop Time 1430   PT Time Calculation (min) 43 min   Equipment Utilized During Treatment Gait belt   Activity Tolerance Patient tolerated treatment well   Behavior During Therapy [facility] for tasks assessed/performed      Past Medical History  Diagnosis Date  . Hypertension   . Diabetes mellitus without complication ([place])   . Obesity   . Peripheral neuropathy ([place])   . DVT (deep venous thrombosis) ([place])   . Peripheral neuropathy ([place])   . Multinodular goiter   . Pure hypercholesterolemia   . Gout   . Arthritis   . Cancer ([place])   . Peripheral vascular disease ([place])   . Sleep apnea   . Vitamin D deficiency   . Thrombocytopenia [facility])     Past Surgical History  Procedure Laterality Date  . Mastectomy    . Breast surgery    . Joint replacement    . Wisdom tooth extraction    . Colonoscopy with propofol N/A 03/05/2015    Procedure: COLONOSCOPY WITH PROPOFOL;  Surgeon: Manya Silvas, MD;  Location: [facility] ENDOSCOPY;  Service: Endoscopy;  Laterality: N/A;    There were no vitals filed for this visit.  Visit Diagnosis:  Unsteadiness on feet  Weakness      Subjective Assessment - 06/18/15 1543    Subjective Pt relates her right knee has been bothering her, especially on the lateral aspect for the past few day and does not know why.  She notices the pain mostly when walking, which remains constant.  Pt denies any falls or near falls since her last visit.      Patient Stated Goals  Pt would like to walk without falling down.     Currently in Pain? No/denies   Pain Score 0-No pain      There ex: Recumbent bike level 3 x4 min.   no charge 13.88 sec 5x sit to stand Leg press with 75 # x10, pt required cueing to perform eccentric phase at a slower pace to improve control Leg press with 90# x10 R LE press with 60# x4, deferred due to increased right knee pain  SLR x10, pt required verbal cueing to maintain LEs straight to prevent knee flexion   Bridge x10, pt required verbal cueing for gluteal activation  Neuro re-ed Forward gait on AIREX beam x2 laps, pt required UE assist to perform exercise and was deferred  Side stepping on AIREX beam x4 laps, pt required occasional UE assist initially to maintain balance, pt required cueing to decrease posterior trunk lean Toe taps on AIREX x10 Toe taps on AIREX x10 with instruction to perform exercise as fast as possible, pt's posterior trunk lean increases with speed and required verbal and tactile cues to decrease lean Stepping over half foam roll and back 2x10 each LE, pt required verbal and tactile cues to weight shift and decrease posterior trunk lean  Pt required close supervision-CGA throughout session for  safety                           PT Education - 06/18/15 1544    Education provided Yes   Education Details R ITB stretch, technique to improve gluteal activaiton during bridges and new balance exercises    Person(s) Educated Patient   Methods Explanation   Comprehension Verbalized understanding             PT Long Term Goals - 06/05/15 1423    PT LONG TERM GOAL #1   Title pt's Berg balance score will improve by at least 6 points indicating decreased fall risk    Baseline 49/56 on eval (10/18)   Time 4   Period Weeks   Status New   PT LONG TERM GOAL #2   Title pt's 5x sit to stand will be less than 11 sec for average time for her age indicating improved functional LE    Baseline  16.62 sec on eval (10/18)   Time 4   Period Weeks   Status New   PT LONG TERM GOAL #3   Title pt's gait speed will be greater than 0.8 m/s with LRAD for safe community ambulation    Baseline 0.72 m/s on eval (10/18) with no assistive device               Plan - 06/18/15 1720    Clinical Impression Statement Pt session focused on neuro re-ed versus there ex due to right knee pain with certain exercises.  pt tendency to posteriorly lean during balance activities and requires further instruction/cues to decrease lean to improve balance.  Pt is progressing well with PT and demonstrates improved LE strength.    Pt will benefit from skilled therapeutic intervention in order to improve on the following deficits Decreased strength;Postural dysfunction;Decreased balance;Decreased activity tolerance   Rehab Potential Fair   PT Frequency 2x / week   PT Duration 4 weeks   PT Treatment/Interventions Biofeedback;Cryotherapy;Electrical Stimulation;Moist Heat;Therapeutic exercise;Therapeutic activities;Functional mobility training;Stair training;Gait training;Balance training;Neuromuscular re-education;Manual techniques;Vestibular   PT Next Visit Plan HEP progressoin         Problem List Patient Active Problem List   Diagnosis Date Noted  . Bilateral breast cancer ([place]) 05/23/2015  . Headache, tension-type 02/23/2015  . Lacunar infarction ([place]) 12/21/2014  . Hay fever 11/24/2014  . Other long term (current) drug therapy 11/24/2014  . Avitaminosis D 05/20/2014  . Arthritis of knee, degenerative 05/20/2014  . Chronic painful diabetic neuropathy ([place]) 05/20/2014  . Type 2 diabetes mellitus ([place]) 04/13/2014  . Gout 04/13/2014  . 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 Obstructive apnea 12/29/2013  . Adiposity 12/29/2013  . Absolute anemia 11/26/2013  . Benign essential HTN 11/26/2013  . History of artificial joint 11/26/2013  . Pure hypercholesterolemia 11/26/2013  . Disease of nail  11/26/2013   Renford Dills, SPT This entire session was performed under direct supervision and direction of a licensed therapist/therapist assistant . I have personally read, edited and approve of the note as written. Gorden Harms. Tortorici, PT, DPT [phone]  Tortorici,Ashley 06/19/2015, 11:11 AM  [place] MAIN [facility] SERVICES [address] [place], [place], [address] Phone: [phone]   Fax:  [phone]  Name: Tiphanie Vo MRN: [id number] Date of Birth: 06-13-1941

## 2015-06-19 ENCOUNTER — Encounter: Payer: Self-pay | Admitting: Hematology and Oncology

## 2015-06-20 ENCOUNTER — Encounter: Payer: Self-pay | Admitting: Hematology and Oncology

## 2015-06-20 ENCOUNTER — Ambulatory Visit: Payer: Medicare Other | Attending: Neurology

## 2015-06-20 ENCOUNTER — Inpatient Hospital Stay: Payer: Medicare Other | Attending: Hematology and Oncology | Admitting: Hematology and Oncology

## 2015-06-20 ENCOUNTER — Inpatient Hospital Stay: Payer: Medicare Other

## 2015-06-20 VITALS — BP 139/57 | HR 56 | Temp 95.3°F | Resp 18 | Ht 62.0 in | Wt 187.3 lb

## 2015-06-20 DIAGNOSIS — I739 Peripheral vascular disease, unspecified: Secondary | ICD-10-CM | POA: Insufficient documentation

## 2015-06-20 DIAGNOSIS — C50912 Malignant neoplasm of unspecified site of left female breast: Secondary | ICD-10-CM | POA: Insufficient documentation

## 2015-06-20 DIAGNOSIS — E7800 Pure hypercholesterolemia, unspecified: Secondary | ICD-10-CM | POA: Diagnosis not present

## 2015-06-20 DIAGNOSIS — E119 Type 2 diabetes mellitus without complications: Secondary | ICD-10-CM | POA: Insufficient documentation

## 2015-06-20 DIAGNOSIS — Z87891 Personal history of nicotine dependence: Secondary | ICD-10-CM | POA: Diagnosis not present

## 2015-06-20 DIAGNOSIS — Z9221 Personal history of antineoplastic chemotherapy: Secondary | ICD-10-CM

## 2015-06-20 DIAGNOSIS — G4730 Sleep apnea, unspecified: Secondary | ICD-10-CM | POA: Insufficient documentation

## 2015-06-20 DIAGNOSIS — G629 Polyneuropathy, unspecified: Secondary | ICD-10-CM | POA: Insufficient documentation

## 2015-06-20 DIAGNOSIS — Z7984 Long term (current) use of oral hypoglycemic drugs: Secondary | ICD-10-CM | POA: Insufficient documentation

## 2015-06-20 DIAGNOSIS — Z9013 Acquired absence of bilateral breasts and nipples: Secondary | ICD-10-CM | POA: Insufficient documentation

## 2015-06-20 DIAGNOSIS — R51 Headache: Secondary | ICD-10-CM | POA: Diagnosis not present

## 2015-06-20 DIAGNOSIS — I10 Essential (primary) hypertension: Secondary | ICD-10-CM | POA: Diagnosis not present

## 2015-06-20 DIAGNOSIS — D649 Anemia, unspecified: Secondary | ICD-10-CM | POA: Insufficient documentation

## 2015-06-20 DIAGNOSIS — M109 Gout, unspecified: Secondary | ICD-10-CM | POA: Diagnosis not present

## 2015-06-20 DIAGNOSIS — M1990 Unspecified osteoarthritis, unspecified site: Secondary | ICD-10-CM | POA: Diagnosis not present

## 2015-06-20 DIAGNOSIS — Z923 Personal history of irradiation: Secondary | ICD-10-CM | POA: Diagnosis not present

## 2015-06-20 DIAGNOSIS — C50911 Malignant neoplasm of unspecified site of right female breast: Secondary | ICD-10-CM

## 2015-06-20 DIAGNOSIS — Z79811 Long term (current) use of aromatase inhibitors: Secondary | ICD-10-CM | POA: Insufficient documentation

## 2015-06-20 DIAGNOSIS — R531 Weakness: Secondary | ICD-10-CM | POA: Insufficient documentation

## 2015-06-20 DIAGNOSIS — Z7982 Long term (current) use of aspirin: Secondary | ICD-10-CM | POA: Insufficient documentation

## 2015-06-20 DIAGNOSIS — M810 Age-related osteoporosis without current pathological fracture: Secondary | ICD-10-CM

## 2015-06-20 DIAGNOSIS — Z170 Estrogen receptor positive status [ER+]: Secondary | ICD-10-CM | POA: Diagnosis not present

## 2015-06-20 DIAGNOSIS — Z79899 Other long term (current) drug therapy: Secondary | ICD-10-CM | POA: Diagnosis not present

## 2015-06-20 DIAGNOSIS — Z853 Personal history of malignant neoplasm of breast: Secondary | ICD-10-CM | POA: Diagnosis not present

## 2015-06-20 DIAGNOSIS — E079 Disorder of thyroid, unspecified: Secondary | ICD-10-CM | POA: Insufficient documentation

## 2015-06-20 DIAGNOSIS — R2681 Unsteadiness on feet: Secondary | ICD-10-CM | POA: Insufficient documentation

## 2015-06-20 LAB — IRON AND TIBC
Iron: 33 ug/dL (ref 28–170)
Saturation Ratios: 12 % (ref 10.4–31.8)
TIBC: 270 ug/dL (ref 250–450)
UIBC: 237 ug/dL

## 2015-06-20 LAB — FERRITIN: Ferritin: 173 ng/mL (ref 11–307)

## 2015-06-20 LAB — CBC
HCT: 37.2 % (ref 35.0–47.0)
Hemoglobin: 12.1 g/dL (ref 12.0–16.0)
MCH: 29.1 pg (ref 26.0–34.0)
MCHC: 32.4 g/dL (ref 32.0–36.0)
MCV: 89.7 fL (ref 80.0–100.0)
Platelets: 178 10*3/uL (ref 150–440)
RBC: 4.15 MIL/uL (ref 3.80–5.20)
RDW: 14.9 % — ABNORMAL HIGH (ref 11.5–14.5)
WBC: 8.9 10*3/uL (ref 3.6–11.0)

## 2015-06-20 LAB — TSH: TSH: 1.981 u[IU]/mL (ref 0.350–4.500)

## 2015-06-20 LAB — RETICULOCYTES
RBC.: 4.15 MIL/uL (ref 3.80–5.20)
Retic Count, Absolute: 49.8 10*3/uL (ref 19.0–183.0)
Retic Ct Pct: 1.2 % (ref 0.4–3.1)

## 2015-06-20 LAB — FOLATE: Folate: 41 ng/mL (ref >5.9)

## 2015-06-20 NOTE — Progress Notes (Signed)
Pt reports no changes other than cold symptoms she is now getting over.  Cold symptoms treated by PCP

## 2015-06-20 NOTE — Progress Notes (Signed)
Crystal Frederick is a 74 y.o. female with a history of bilateral breast cancer on Arimidex who is seen for review of interval bone density study.  HPI: The patient was last seen in the medical oncology clinic on 05/23/2015.  At that time, she has felt "so-so". She noted problems with Januvia, her diabetic medication. She denied any breast concerns.  She was tolerating her Arimidex well.   Labs included a hematocrit of 34.7, hemoglobin 11.4, and MCV 88.8.  Comprehensive metabolic panel revealed a creatinine of 1.05 (CrCl 50-58 ml/min).  Liver function tests were normal.  CA27.29 was 30.9 (normal).  A bone density study was ordered.  She was to see her dentist.  Bone density study on 06/04/2015 revealed a osteoporosis with a T score of -3.2 in the forearm, -2.3 in the left femoral neck, and -0.9 in the left hip.  She was unable to follow-up with dentistry.  She is on Fosamax weekly.  She notes a history of anemia for years.  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  She states he had a colonoscopy with Dr. Vira Agar on 03/05/2015. It was "okay". She denies any melena or hematochezia. Regarding her diet, she eats a lot of fruit and vegetables. She is eating less. She eats meat about 3-4 times a week. She denies any pica.  During the interim, she notes that she aches all over.  Past Medical History  Diagnosis Date  . Hypertension   . Diabetes mellitus without complication ([place])   . Obesity   . Peripheral neuropathy ([place])   . DVT (deep venous thrombosis) ([place])   . Peripheral neuropathy ([place])   . Multinodular goiter   . Pure hypercholesterolemia   . Gout   . Arthritis   . Cancer ([place])   . Peripheral vascular disease ([place])   . Sleep apnea   . Vitamin D deficiency   . Thrombocytopenia [facility])     Past Surgical History  Procedure Laterality Date  . Mastectomy    . Breast surgery    .  Joint replacement    . Wisdom tooth extraction    . Colonoscopy with propofol N/A 03/05/2015    Procedure: COLONOSCOPY WITH PROPOFOL;  Surgeon: Manya Silvas, MD;  Location: [facility] ENDOSCOPY;  Service: Endoscopy;  Laterality: N/A;    Family History  Problem Relation Age of Onset  . Hypertension Mother   . Cancer Mother     Breast  . Cancer Father     Leukemia    Social History:  reports that she has quit smoking. She has never used smokeless tobacco. She reports that she does not drink alcohol or use illicit drugs.  The patient is alone today.  Allergies:  Allergies  Allergen Reactions  . Celebrex [Celecoxib] Other (See Comments)  . Metformin And Related Diarrhea  . Tramadol Nausea And Vomiting  . Voltaren [Diclofenac Sodium] Nausea And Vomiting    Current Medications: Current Outpatient Prescriptions  Medication Sig Dispense Refill  . acetaminophen (TYLENOL) 500 MG tablet Take 1,000 mg by mouth daily.    Marland Kitchen albuterol (PROVENTIL HFA;VENTOLIN HFA) 108 (90 BASE) MCG/ACT inhaler Inhale 2 puffs into the lungs every 6 (six) hours as needed for wheezing or shortness of breath.    Marland Kitchen alendronate (FOSAMAX) 70 MG tablet Take by mouth.    . Alpha-Lipoic Acid 200 MG TABS Take 200 mg by mouth daily.    Marland Kitchen anastrozole (ARIMIDEX)  1 MG tablet TAKE ONE TABLET BY MOUTH ONCE DAILY 30 tablet 0  . aspirin EC 81 MG tablet Take 81 mg by mouth daily.    Marland Kitchen atorvastatin (LIPITOR) 20 MG tablet Take 20 mg by mouth daily.    . baclofen (LIORESAL) 10 MG tablet Take 10 mg by mouth 2 (two) times daily.    . benzonatate (TESSALON) 100 MG capsule Take by mouth.    . bisoprolol (ZEBETA) 5 MG tablet Take 5 mg by mouth daily.    . calcium carbonate (OS-CAL) 600 MG TABS tablet Take 600 mg by mouth 2 (two) times daily with a meal.    . ergocalciferol (VITAMIN D2) 50000 UNITS capsule Take 50,000 Units by mouth once a week.    . fluticasone (FLONASE) 50 MCG/ACT nasal spray Place 2 sprays into both nostrils daily.     . furosemide (LASIX) 40 MG tablet Take 40 mg by mouth daily.    Marland Kitchen gabapentin (NEURONTIN) 300 MG capsule Take 300 mg by mouth 3 (three) times daily.    Marland Kitchen glimepiride (AMARYL) 4 MG tablet Take 4 mg by mouth daily with breakfast.    . glucose blood test strip 1 each by Other route as needed for other. Use as instructed    . levothyroxine (SYNTHROID, LEVOTHROID) 50 MCG tablet Take 50 mcg by mouth daily before breakfast.    . losartan (COZAAR) 100 MG tablet Take 100 mg by mouth daily.    . magnesium oxide (MAG-OX) 400 MG tablet Take by mouth.    . metFORMIN (GLUCOPHAGE-XR) 500 MG 24 hr tablet Take 500 mg by mouth daily with breakfast.    . MULTIPLE VITAMIN-FOLIC ACID PO Take 1 tablet by mouth daily.    Marland Kitchen omeprazole (PRILOSEC) 20 MG capsule Take 20 mg by mouth daily.    . sitaGLIPtin (JANUVIA) 100 MG tablet Take by mouth.    . vitamin C (ASCORBIC ACID) 500 MG tablet Take 500 mg by mouth daily.     No current facility-administered medications for this visit.    Review of Systems:  GENERAL:  Feels "ok".  No fevers, sweats or weight loss. PERFORMANCE STATUS (ECOG):  1 HEENT:  No visual changes, runny nose, sore throat, mouth sores or tenderness.  No dental follow-up. Lungs: No shortness of breath or cough.  No hemoptysis. Cardiac:  No chest pain, palpitations, orthopnea, or PND. GI:  No nausea, vomiting, diarrhea, constipation, melena or hematochezia.  Colonoscopy 03/05/2015. GU:  No urgency, frequency, dysuria, or hematuria. Musculoskeletal:  Aches all over. No muscle tenderness. Extremities:  No pain or swelling. Skin:  No rashes or skin changes. Neuro:  No headache, numbness or weakness, balance or coordination issues. Endocrine:  Diabetes.  Issues with Januvia.  No thyroid issues, hot flashes or night sweats. Psych:  No mood changes, depression or anxiety. Pain:  No focal pain. Review of systems:  All other systems reviewed and found to be negative.  Physical Exam: Blood pressure  139/57, pulse 56, temperature 95.3 F (35.2 C), temperature source Tympanic, resp. rate 18, height 5' 2"  (1.575 m), weight 187 lb 4.1 oz (84.94 kg). GENERAL:  Well developed, well nourished, sitting comfortably in the exam room in no acute distress.  She has a cane at her side. MENTAL STATUS:  Alert and oriented to person, place and time. HEAD:  Thin short dark hair.  Normocephalic, atraumatic, face symmetric, no Cushingoid features. EYES:  Brown eyes.  No conjunctivitis or scleral icterus. NEUROLOGICAL: Unremarkable. PSYCH:  Appropriate.  No visits with results within 3 Day(s) from this visit. Latest known visit with results is:  Appointment on 05/23/2015  Component Date Value Ref Range Status  . WBC 05/23/2015 7.6  3.6 - 11.0 K/uL Final  . RBC 05/23/2015 3.90  3.80 - 5.20 MIL/uL Final  . Hemoglobin 05/23/2015 11.4* 12.0 - 16.0 g/dL Final  . HCT 05/23/2015 34.7* 35.0 - 47.0 % Final  . MCV 05/23/2015 88.8  80.0 - 100.0 fL Final  . MCH 05/23/2015 29.2  26.0 - 34.0 pg Final  . MCHC 05/23/2015 32.9  32.0 - 36.0 g/dL Final  . RDW 05/23/2015 15.0* 11.5 - 14.5 % Final  . Platelets 05/23/2015 150  150 - 440 K/uL Final  . Neutrophils Relative % 05/23/2015 68   Final  . Neutro Abs 05/23/2015 5.3  1.4 - 6.5 K/uL Final  . Lymphocytes Relative 05/23/2015 17   Final  . Lymphs Abs 05/23/2015 1.3  1.0 - 3.6 K/uL Final  . Monocytes Relative 05/23/2015 9   Final  . Monocytes Absolute 05/23/2015 0.7  0.2 - 0.9 K/uL Final  . Eosinophils Relative 05/23/2015 5   Final  . Eosinophils Absolute 05/23/2015 0.4  0 - 0.7 K/uL Final  . Basophils Relative 05/23/2015 1   Final  . Basophils Absolute 05/23/2015 0.1  0 - 0.1 K/uL Final  . Sodium 05/23/2015 142  135 - 145 mmol/L Final  . Potassium 05/23/2015 4.6  3.5 - 5.1 mmol/L Final  . Chloride 05/23/2015 106  101 - 111 mmol/L Final  . CO2 05/23/2015 29  22 - 32 mmol/L Final  . Glucose, Bld 05/23/2015 83  65 - 99 mg/dL Final  . BUN 05/23/2015 27* 6 - 20 mg/dL  Final  . Creatinine, Ser 05/23/2015 1.05* 0.44 - 1.00 mg/dL Final  . Calcium 05/23/2015 9.2  8.9 - 10.3 mg/dL Final  . Total Protein 05/23/2015 7.1  6.5 - 8.1 g/dL Final  . Albumin 05/23/2015 3.7  3.5 - 5.0 g/dL Final  . AST 05/23/2015 17  15 - 41 U/L Final  . ALT 05/23/2015 13* 14 - 54 U/L Final  . Alkaline Phosphatase 05/23/2015 68  38 - 126 U/L Final  . Total Bilirubin 05/23/2015 0.6  0.3 - 1.2 mg/dL Final  . GFR calc non Af Amer 05/23/2015 50* >60 mL/min Final  . GFR calc Af Amer 05/23/2015 58* >60 mL/min Final   Comment: (NOTE) The eGFR has been calculated using the CKD EPI equation. This calculation has not been validated in all clinical situations. eGFR's persistently <60 mL/min signify possible Chronic Kidney Disease.   . Anion gap 05/23/2015 7  5 - 15 Final  . CA 27.29 05/23/2015 30.9  0.0 - 38.6 U/mL Final   Comment: (NOTE) [employer] [facility]/ACS methodology Performed At: [facility] [address] [place], [place] [id number] Lindon Romp MD BS:[id number]     Assessment:  Crystal Frederick is a 74 y.o. 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  She presented in 04/1998 with a 4 x 2.2 x 2.2 cm right breast cancer.  She underwent lumpectomy.  Tumor was ER, PR, and Her2/neu negative.  She received adjuvant adriamycin and Cytoxan (AC) followed by Taxol.  She received radiation.  She developed recurrent disease in 12/2000.  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  She underwent modified radical mastectomy.  Pathology revealed a 3.5 x 3 x 2 cm metastatic adenocarcinoma  with ossification.  She developed left breast cancer in 11/2009.  She underwent mastectomy in 12/2009.  Tumor was ER positive, PR positive, and  Her2/neu negative.  Oncotype score was 33 (high).  She received 6 cycles of Taxotere and Cytoxan (TC) from 01/29/2010 - 05/15/2010.  She completed left chest wall  radiation in 07/2010.  Arimidex started in 07/2010.  She is tolerating it well.  CA 27-29 was 30.9 (normal) on 05/23/2015.  She fell in 10/2013 and sustained an orbital fracture.  CT face revealed a subpleural density in both upper lobes felt secondary to scarring.  She is being followed by Dr. Raul Del of pulmonary medicine.   She had an intermittent headache and a pressure sensation on the right side of her head and balance problem for 4-5 months.  Head MRI on 11/30/2014 revealed no metastatic disease. She had small areas of hemosiderin both anterior frontal lobes and also the right posterior temporal lobe sequela of trauma. There was chronic left sided MCA infarct. Cervical spine MRI on 11/30/2014 revealed subtle signal changes along the ventral C4 and C5-C6 level suspicious for posterior anterior ligamentous injury. There was widespread cervical spine disc and endplate degeneration. There was no evidence of metastatic disease.  Bone density study on 06/04/2015 revealed a osteoporosis with a T score of -3.2 in the forearm, -2.3 in the left femoral neck, and -0.9 in the left hip.  She is on Fosamax weekly.  She has a new normocytic anemia (HCTwas 40.4 on 11/22/2014 and 34.7 on 05/23/2015).  She has a history of Celebrex-induced gastritis/ulcer.  Per patient report,  colonoscopy on 03/05/2015 was "okay". She denies any melena or hematochezia. She eats meat about 3-4 times a week. She denies any pica.  Symptomatically, she feels achy.  Exam reveals post-operative changes.  She has a new normocytic anemia.  Plan: 1.  Review labs and bone density study since last visit.  Patient denies any bleeding 2.  Labs today:  CBC, ferritin, iron studies, retic, B12, folate, TSH. 3.  Discuss continuation of calcium, vitamin D, Fosamax. 4.  RTC in 1-2 weeks for MD review of anemia work-up 5.  RTC on 11/28/2014 for MD assess and labs (CBC with diff, CMP, CA27.29, ferritin).   Lequita Asal, MD  06/20/2015,  10:59 AM

## 2015-06-20 NOTE — Therapy (Signed)
[place] MAIN [facility] SERVICES [address] [place], [place], [address] Phone: [phone]   Fax:  [phone]  Physical Therapy Treatment  Patient Details  Name: Crystal Frederick MRN: [id number] Date of Birth: 09/03/1940 Referring Provider: Dr. Brigitte Pulse  Encounter Date: 06/20/2015      PT End of Session - 06/20/15 1905    Visit Number 4   Number of Visits 9   Date for PT Re-Evaluation 07/03/15   Authorization Type Dec 09, 2022 G codes   PT Start Time 6962   PT Stop Time 1430   PT Time Calculation (min) 45 min   Equipment Utilized During Treatment Gait belt   Activity Tolerance Patient limited by pain   Behavior During Therapy [facility] for tasks assessed/performed      Past Medical History  Diagnosis Date  . Hypertension   . Diabetes mellitus without complication ([place])   . Obesity   . Peripheral neuropathy ([place])   . DVT (deep venous thrombosis) ([place])   . Peripheral neuropathy ([place])   . Multinodular goiter   . Pure hypercholesterolemia   . Gout   . Arthritis   . Cancer ([place])   . Peripheral vascular disease ([place])   . Sleep apnea   . Vitamin D deficiency   . Thrombocytopenia [facility])     Past Surgical History  Procedure Laterality Date  . Mastectomy    . Breast surgery    . Joint replacement    . Wisdom tooth extraction    . Colonoscopy with propofol N/A 03/05/2015    Procedure: COLONOSCOPY WITH PROPOFOL;  Surgeon: Manya Silvas, MD;  Location: [facility] ENDOSCOPY;  Service: Endoscopy;  Laterality: N/A;    There were no vitals filed for this visit.  Visit Diagnosis:  Unsteadiness on feet  Weakness      Subjective Assessment - 06/20/15 1854    Subjective Pt has not been feeling well since she woke up this morning.  She has 8/10 pain in her low back and lower abdomen/groin and in her left flank.  Her pain is intermittent with most intense pain occurring with movement.  Pt reports she was informed today she is anemic and her MD is trying  to find out why.     Patient Stated Goals Pt would like to walk without falling down.     Pain Score 8    Pain Location Back   Pain Orientation Lower   Pain Descriptors / Indicators Aching;Sharp      There ex: Nustep level 2 x5 min: no charge Knee to chest 3x30 sec, pt required mod A to perform exercise Lumbar rotations x10 each side and required cueing to perform exercise within pain free range Instructed pt on log roll to decrease stress on back when performing bed mobility SPT assessed vitals after pt c/o abdominal pain Vitals: 126/68 mmHg,  59 pulse Sit to stands x10, no pain  Standing hip abduction/flexion/extension x10 each LE, pt instructed to perform exercise within pain free range and to perform at slower pace Heel raises x10 Pt reported decreased pain at the end of the session                            PT Education - 06/20/15 1901    Education provided Yes   Education Details plan of care, seek medical attention if pain worsens    Person(s) Educated Patient   Methods Explanation   Comprehension Verbalized understanding  PT Long Term Goals - 06/05/15 1423    PT LONG TERM GOAL #1   Title pt's Berg balance score will improve by at least 6 points indicating decreased fall risk    Baseline 49/56 on eval (10/18)   Time 4   Period Weeks   Status New   PT LONG TERM GOAL #2   Title pt's 5x sit to stand will be less than 11 sec for average time for her age indicating improved functional LE    Baseline 16.62 sec on eval (10/18)   Time 4   Period Weeks   Status New   PT LONG TERM GOAL #3   Title pt's gait speed will be greater than 0.8 m/s with LRAD for safe community ambulation    Baseline 0.72 m/s on eval (10/18) with no assistive device               Plan - 06/20/15 1906    Clinical Impression Statement SPT modified today's session to focus on strengthening LEs in positions that did not exacerbate pt's back pain. Pt  initially experienced increased pain with exercises but by the end of session she had decreased pain and was able to ambulate with improved gait speed and with less pain. pts vitals were assessed and were normal. Pt advised to seek medical attention if her abdominal pain continues or worsens.    Pt will benefit from skilled therapeutic intervention in order to improve on the following deficits Decreased strength;Postural dysfunction;Decreased balance;Decreased activity tolerance   Rehab Potential Fair   PT Frequency 2x / week   PT Duration 4 weeks   PT Treatment/Interventions Biofeedback;Cryotherapy;Electrical Stimulation;Moist Heat;Therapeutic exercise;Therapeutic activities;Functional mobility training;Stair training;Gait training;Balance training;Neuromuscular re-education;Manual techniques;Vestibular   PT Next Visit Plan HEP progressoin         Problem List Patient Active Problem List   Diagnosis Date Noted  . Bilateral breast cancer ([place]) 05/23/2015  . Headache, tension-type 02/23/2015  . Lacunar infarction ([place]) 12/21/2014  . Hay fever 11/24/2014  . Other long term (current) drug therapy 11/24/2014  . Avitaminosis D 05/20/2014  . Arthritis of knee, degenerative 05/20/2014  . Chronic painful diabetic neuropathy ([place]) 05/20/2014  . Type 2 diabetes mellitus ([place]) 04/13/2014  . Gout 04/13/2014  . 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 Obstructive apnea 12/29/2013  . Adiposity 12/29/2013  . Absolute anemia 11/26/2013  . Benign essential HTN 11/26/2013  . History of artificial joint 11/26/2013  . Pure hypercholesterolemia 11/26/2013  . Disease of nail 11/26/2013   Renford Dills, SPT This entire session was performed under direct supervision and direction of a licensed therapist/therapist assistant . I have personally read, edited and approve of the note as written. Gorden Harms. Tortorici, PT, DPT [phone]  Tortorici,Ashley 06/21/2015, 9:43 AM  [place] MAIN [facility] SERVICES [address] [place], [place], [address] Phone: [phone]   Fax:  [phone]  Name: Crystal Frederick MRN: [id number] Date of Birth: Feb 06, 1941

## 2015-06-21 LAB — VITAMIN B12: Vitamin B-12: 457 pg/mL (ref 180–914)

## 2015-06-24 ENCOUNTER — Other Ambulatory Visit: Payer: Self-pay | Admitting: Hematology and Oncology

## 2015-06-27 ENCOUNTER — Ambulatory Visit: Payer: Medicare Other

## 2015-06-27 DIAGNOSIS — R2681 Unsteadiness on feet: Secondary | ICD-10-CM | POA: Diagnosis not present

## 2015-06-27 DIAGNOSIS — R531 Weakness: Secondary | ICD-10-CM

## 2015-06-28 NOTE — Therapy (Signed)
[place] MAIN [facility] SERVICES [address] [place], [place], [address] Phone: [phone]   Fax:  [phone]  Physical Therapy Treatment  Patient Details  Name: Crystal Frederick MRN: [id number] Date of Birth: Sep 15, 1940 Referring Provider: Dr. Brigitte Pulse  Encounter Date: 06/27/2015      PT End of Session - 06/28/15 1531    Visit Number 5   Number of Visits 9   Date for PT Re-Evaluation 07/03/15   Authorization Type 5/10 G codes   PT Start Time 1115   PT Stop Time 1200   PT Time Calculation (min) 45 min   Equipment Utilized During Treatment Gait belt   Activity Tolerance Patient tolerated treatment well   Behavior During Therapy [facility] for tasks assessed/performed      Past Medical History  Diagnosis Date  . Hypertension   . Diabetes mellitus without complication ([place])   . Obesity   . Peripheral neuropathy ([place])   . DVT (deep venous thrombosis) ([place])   . Peripheral neuropathy ([place])   . Multinodular goiter   . Pure hypercholesterolemia   . Gout   . Arthritis   . Cancer ([place])   . Peripheral vascular disease ([place])   . Sleep apnea   . Vitamin D deficiency   . Thrombocytopenia [facility])     Past Surgical History  Procedure Laterality Date  . Mastectomy    . Breast surgery    . Joint replacement    . Wisdom tooth extraction    . Colonoscopy with propofol N/A 03/05/2015    Procedure: COLONOSCOPY WITH PROPOFOL;  Surgeon: Manya Silvas, MD;  Location: [facility] ENDOSCOPY;  Service: Endoscopy;  Laterality: N/A;    There were no vitals filed for this visit.  Visit Diagnosis:  Unsteadiness on feet  Weakness      Subjective Assessment - 06/28/15 1530    Subjective Pt relates she is doing much better today compared to her last session and her right knee feels the same as always with 5-6/10 pain.  pt has not done her HEP since last session due to fear of developing the pain she experienced last week.      Patient Stated Goals Pt would  like to walk without falling down.     Currently in Pain? No/denies   Pain Score 0-No pain      Neuro re-ed: Tandem walking in //bars x4 laps, with cues look forward versus down at her feet and to decrease use of UE assist Toe taps on AIREX 2x15 each LE, with instruction to perform exercise with "light feet" and as quick as possible  Step on airex followed by 6 inch step and then down to AIREX followed by level surface 2x10 with step over step pattern with SBA for safety  Mini squats on airex with yellow medicine ball, with cues to increase hip hinge to decrease pain in her knees Single leg stance on AIREX x10 each LE until LOB In hallway- pt ambulates at normal speed finding playing cards on wall 81ft x 1 - SBA for safety and cues to maintain cadence   Side stepping 2x40 ft each side while facing opposite direction, with vertical head turns    There ex: Sit to stand x10, with cues to decrease rocking/using her momentum to stand Double leg press with 70# x10 Double leg press with 90# 2x10 Mini squats in //bars x10 Pt required cues for slower pace to increase control and verbal cues for correct exercise technique  PT Education - 06/28/15 1531    Education provided Yes   Education Details strengthening and balance exercises    Person(s) Educated Patient   Methods Explanation   Comprehension Verbalized understanding             PT Long Term Goals - 06/05/15 1423    PT LONG TERM GOAL #1   Title pt's Berg balance score will improve by at least 6 points indicating decreased fall risk    Baseline 49/56 on eval (10/18)   Time 4   Period Weeks   Status New   PT LONG TERM GOAL #2   Title pt's 5x sit to stand will be less than 11 sec for average time for her age indicating improved functional LE    Baseline 16.62 sec on eval (10/18)   Time 4   Period Weeks   Status New   PT LONG TERM GOAL #3   Title pt's gait speed will be  greater than 0.8 m/s with LRAD for safe community ambulation    Baseline 0.72 m/s on eval (10/18) with no assistive device               Plan - 06/28/15 1532    Clinical Impression Statement Pt did really well today and did not experience LOB while maneuvering around the gym.  Her performance with neuro re-ed exercises improved with reach repetition and by the last set of each exercise did not require UE assist. Pt requires verbal encouragement to attempt exercises initially due to her concern of not being able to perform the exercise.     Pt will benefit from skilled therapeutic intervention in order to improve on the following deficits Decreased strength;Postural dysfunction;Decreased balance;Decreased activity tolerance   Rehab Potential Fair   PT Frequency 2x / week   PT Duration 4 weeks   PT Treatment/Interventions Biofeedback;Cryotherapy;Electrical Stimulation;Moist Heat;Therapeutic exercise;Therapeutic activities;Functional mobility training;Stair training;Gait training;Balance training;Neuromuscular re-education;Manual techniques;Vestibular   PT Next Visit Plan HEP progressoin         Problem List Patient Active Problem List   Diagnosis Date Noted  . Bilateral breast cancer ([place]) 05/23/2015  . Headache, tension-type 02/23/2015  . Lacunar infarction ([place]) 12/21/2014  . Hay fever 11/24/2014  . Other long term (current) drug therapy 11/24/2014  . Avitaminosis D 05/20/2014  . Arthritis of knee, degenerative 05/20/2014  . Chronic painful diabetic neuropathy ([place]) 05/20/2014  . Type 2 diabetes mellitus ([place]) 04/13/2014  . Gout 04/13/2014  . 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 Obstructive apnea 12/29/2013  . Adiposity 12/29/2013  . Absolute anemia 11/26/2013  . Benign essential HTN 11/26/2013  . History of artificial joint 11/26/2013  . Pure hypercholesterolemia 11/26/2013  . Disease of nail 11/26/2013   Renford Dills, SPT This entire session was performed under  direct supervision and direction of a licensed therapist/therapist assistant . I have personally read, edited and approve of the note as written. Gorden Harms. Tortorici, PT, DPT [phone]  Tortorici,Ashley 06/28/2015, 4:44 PM  [place] MAIN [facility] SERVICES [address] [place], [place], [address] Phone: [phone]   Fax:  [phone]  Name: Ruah Noeth MRN: [id number] Date of Birth: 30-Mar-1941

## 2015-07-02 ENCOUNTER — Ambulatory Visit: Payer: Medicare Other

## 2015-07-02 DIAGNOSIS — R2681 Unsteadiness on feet: Secondary | ICD-10-CM

## 2015-07-02 DIAGNOSIS — R531 Weakness: Secondary | ICD-10-CM

## 2015-07-02 NOTE — Therapy (Signed)
[place] MAIN [facility] SERVICES [address] [place], [place], [address] Phone: [phone]   Fax:  [phone]  Physical Therapy Treatment/Progress Note 10/18-11/14/16  Patient Details  Name: Crystal Frederick MRN: [id number] Date of Birth: 01/08/1941 Referring Provider: Dr. Brigitte Pulse  Encounter Date: 07/02/2015      PT End of Session - 07/02/15 1814    Visit Number 6   Number of Visits 17   Date for PT Re-Evaluation 07/30/15   Authorization Type 1/10 g codes   PT Start Time 0930   PT Stop Time 1015   PT Time Calculation (min) 45 min   Equipment Utilized During Treatment Gait belt   Activity Tolerance Patient tolerated treatment well   Behavior During Therapy [facility] for tasks assessed/performed      Past Medical History  Diagnosis Date  . Hypertension   . Diabetes mellitus without complication ([place])   . Obesity   . Peripheral neuropathy ([place])   . DVT (deep venous thrombosis) ([place])   . Peripheral neuropathy ([place])   . Multinodular goiter   . Pure hypercholesterolemia   . Gout   . Arthritis   . Cancer ([place])   . Peripheral vascular disease ([place])   . Sleep apnea   . Vitamin D deficiency   . Thrombocytopenia [facility])     Past Surgical History  Procedure Laterality Date  . Mastectomy    . Breast surgery    . Joint replacement    . Wisdom tooth extraction    . Colonoscopy with propofol N/A 03/05/2015    Procedure: COLONOSCOPY WITH PROPOFOL;  Surgeon: Manya Silvas, MD;  Location: [facility] ENDOSCOPY;  Service: Endoscopy;  Laterality: N/A;    There were no vitals filed for this visit.  Visit Diagnosis:  Unsteadiness on feet  Weakness      Subjective Assessment - 07/02/15 1811    Subjective pt relates she is doing well and feels like her strength has improved and feels steadier standing and has noticed minimal improvement while walking.  Pt feels she leans laterally when ambulating and would like to continue PT to improve dynamic  balance.     Patient Stated Goals Pt would like to walk without falling down.     Currently in Pain? No/denies   Pain Score 0-No pain       there ex: Outcome measures were administered to assess progress towards PT goals Berg Balance Scale: 53/56 TUG: 11.10 seconds 5X Sit to stand: 11.77 seconds 10MWT: 0.41m/s without AD, pt ambulates with bilateral trendelenburg gait  Pt required close supervision for safety Bilateral hip abduction MMT: 3/5                          PT Education - 07/02/15 1814    Education provided Yes   Education Details plan of care, outcome measuress   Person(s) Educated Patient   Methods Explanation   Comprehension Verbalized understanding             PT Long Term Goals - 07/02/15 1821    PT LONG TERM GOAL #1   Title pt's Berg balance score will improve by at least 6 points indicating decreased fall risk    Baseline 49/56 on eval (10/18); 53/56 on 11/14   Time 4   Period Weeks   Status On-going   PT LONG TERM GOAL #2   Title pt's 5x sit to stand will be less than 11 sec for average  time for her age indicating improved functional LE    Baseline 16.62 sec on eval (10/18); 11.77 seconds on July 11, 2023   Time 4   Period Weeks   Status On-going   PT LONG TERM GOAL #3   Title pt's gait speed will be greater than 0.8 m/s with LRAD for safe community ambulation    Baseline 0.72 m/s on eval (10/18) with no assistive device; 0.78 m/s on 07-11-2023   Status On-going               Plan - 2015/07/11 1821    Clinical Impression Statement Pt is progressing well with PT is has made significant improvements towards her PT goals.  Pt demonstrates improved LE functional strength, steadiness and lower risk of falls.  pt is unsteady during ambulation and demonstrates bilateral trendelenburg gait which is consistent with her weak hip abductor MMT score (3/5) bilaterally.   Pt would benefit from continued PT services to make further gains, lower  risk of falls and improve functional mobility.     Pt will benefit from skilled therapeutic intervention in order to improve on the following deficits Decreased strength;Postural dysfunction;Decreased balance;Decreased activity tolerance   Rehab Potential Fair   PT Frequency 2x / week   PT Duration 4 weeks   PT Treatment/Interventions Biofeedback;Cryotherapy;Electrical Stimulation;Moist Heat;Therapeutic exercise;Therapeutic activities;Functional mobility training;Stair training;Gait training;Balance training;Neuromuscular re-education;Manual techniques;Vestibular   PT Next Visit Plan hip abduction strengthening and dynamic balance          G-Codes - 07-11-15 1822    Functional Assessment Tool Used history, clinical judgmenent, outcome measures    Functional Limitation Mobility: Walking and moving around   Mobility: Walking and Moving Around Current Status [phone]) At least 20 percent but less than 40 percent impaired, limited or restricted   Mobility: Walking and Moving Around Goal Status [phone]) At least 1 percent but less than 20 percent impaired, limited or restricted      Problem List Patient Active Problem List   Diagnosis Date Noted  . Bilateral breast cancer ([place]) 05/23/2015  . Headache, tension-type 02/23/2015  . Lacunar infarction ([place]) 12/21/2014  . Hay fever 11/24/2014  . Other long term (current) drug therapy 11/24/2014  . Avitaminosis D 05/20/2014  . Arthritis of knee, degenerative 05/20/2014  . Chronic painful diabetic neuropathy ([place]) 05/20/2014  . Type 2 diabetes mellitus ([place]) 04/13/2014  . Gout 04/13/2014  . 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 Obstructive apnea 12/29/2013  . Adiposity 12/29/2013  . Absolute anemia 11/26/2013  . Benign essential HTN 11/26/2013  . History of artificial joint 11/26/2013  . Pure hypercholesterolemia 11/26/2013  . Disease of nail 11/26/2013   Renford Dills, SPT This entire session was performed under direct supervision and  direction of a licensed therapist/therapist assistant . I have personally read, edited and approve of the note as written. Gorden Harms. Tortorici, PT, DPT [phone]  Tortorici,Ashley 07/03/2015, 9:31 AM  [place] MAIN [facility] SERVICES [address] [place], [place], [address] Phone: [phone]   Fax:  [phone]  Name: Kinya Revuelta MRN: [id number] Date of Birth: 18-May-1941

## 2015-07-04 ENCOUNTER — Ambulatory Visit: Payer: Medicare Other

## 2015-07-04 DIAGNOSIS — R2681 Unsteadiness on feet: Secondary | ICD-10-CM

## 2015-07-04 DIAGNOSIS — R531 Weakness: Secondary | ICD-10-CM

## 2015-07-05 NOTE — Patient Instructions (Signed)
[email or web address]  Cl[place] 2x10  

## 2015-07-05 NOTE — Therapy (Signed)
[place] MAIN [facility] SERVICES [address] [place], [place], [address] Phone: [phone]   Fax:  [phone]  Physical Therapy Treatment  Patient Details  Name: Crystal Frederick MRN: [id number] Date of Birth: 18-Aug-1941 Referring Provider: Dr. Brigitte Pulse  Encounter Date: 07/04/2015      PT End of Session - 07/05/15 1226    Visit Number 7   Number of Visits 17   Date for PT Re-Evaluation 07/30/15   Authorization Type 2/10 g codes   PT Start Time [id number]   PT Stop Time 1100   PT Time Calculation (min) 45 min   Equipment Utilized During Treatment Gait belt   Activity Tolerance Patient tolerated treatment well   Behavior During Therapy [facility] for tasks assessed/performed      Past Medical History  Diagnosis Date  . Hypertension   . Diabetes mellitus without complication ([place])   . Obesity   . Peripheral neuropathy ([place])   . DVT (deep venous thrombosis) ([place])   . Peripheral neuropathy ([place])   . Multinodular goiter   . Pure hypercholesterolemia   . Gout   . Arthritis   . Cancer ([place])   . Peripheral vascular disease ([place])   . Sleep apnea   . Vitamin D deficiency   . Thrombocytopenia [facility])     Past Surgical History  Procedure Laterality Date  . Mastectomy    . Breast surgery    . Joint replacement    . Wisdom tooth extraction    . Colonoscopy with propofol N/A 03/05/2015    Procedure: COLONOSCOPY WITH PROPOFOL;  Surgeon: Manya Silvas, MD;  Location: [facility] ENDOSCOPY;  Service: Endoscopy;  Laterality: N/A;    There were no vitals filed for this visit.  Visit Diagnosis:  Unsteadiness on feet  Weakness      Subjective Assessment - 07/05/15 1224    Subjective Pt relates she feels well this morning.  Her right knee is "letting her know it's there." Pt is looking forward to trying new exercises to strengthen her hips.     Patient Stated Goals Pt would like to walk without falling down.     Currently in Pain? Yes   Pain Score --   no value provided   Pain Location Knee   Pain Orientation Right;Left        There ex: Nusteplevel 2 x 4 min: no charge  Supine hip abduction with red band 3x10 each LE Clamshells 2x15, pt required tactile cues to maintain hips steady during exercises  Standing hip abduction and extension with red band in //bars 2 x10, pt required tactile cues to maintain hips leveled   Neuor re-ed Marching with instruction to maintain SLS as long as possible in //bars x4 laps Side stepping on AIREX beam in //bars x4 laps with instruction to no use UEs for balance and to look forward versus down at her feet Forward on AIREX beam in //bars x4 laps with instruction to look forward and lean forward versus backwards when advancing LEs Pt required supervision for safety                            PT Education - 07/05/15 1226    Education provided Yes   Education Details new hip abductor strengthening exercises    Person(s) Educated Patient   Methods Explanation   Comprehension Verbalized understanding             PT Long  Term Goals - 07/02/15 1821    PT LONG TERM GOAL #1   Title pt's Berg balance score will improve by at least 6 points indicating decreased fall risk    Baseline 49/56 on eval (10/18); 53/56 on 11/14   Time 4   Period Weeks   Status On-going   PT LONG TERM GOAL #2   Title pt's 5x sit to stand will be less than 11 sec for average time for her age indicating improved functional LE    Baseline 16.62 sec on eval (10/18); 11.77 seconds on 11/14   Time 4   Period Weeks   Status On-going   PT LONG TERM GOAL #3   Title pt's gait speed will be greater than 0.8 m/s with LRAD for safe community ambulation    Baseline 0.72 m/s on eval (10/18) with no assistive device; 0.78 m/s on 11/14   Status On-going               Plan - 07/05/15 1237    Clinical Impression Statement Pt did well with progression of strength exercises, especially the ones that  targeted the hip abductors.  Pt performs her exercises better after first attempting them with UEs for confidence.  Pt would benefit from continued skilled PT services to improve hip strength, especially her hip abductors and improve balance to decrease falls risk   Pt will benefit from skilled therapeutic intervention in order to improve on the following deficits Decreased strength;Postural dysfunction;Decreased balance;Decreased activity tolerance   Rehab Potential Fair   PT Frequency 2x / week   PT Duration 4 weeks   PT Treatment/Interventions Biofeedback;Cryotherapy;Electrical Stimulation;Moist Heat;Therapeutic exercise;Therapeutic activities;Functional mobility training;Stair training;Gait training;Balance training;Neuromuscular re-education;Manual techniques;Vestibular   PT Next Visit Plan hip abduction strengthening and dynamic balance        Problem List Patient Active Problem List   Diagnosis Date Noted  . Bilateral breast cancer ([place]) 05/23/2015  . Headache, tension-type 02/23/2015  . Lacunar infarction ([place]) 12/21/2014  . Hay fever 11/24/2014  . Other long term (current) drug therapy 11/24/2014  . Avitaminosis D 05/20/2014  . Arthritis of knee, degenerative 05/20/2014  . Chronic painful diabetic neuropathy ([place]) 05/20/2014  . Type 2 diabetes mellitus ([place]) 04/13/2014  . Gout 04/13/2014  . 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 Obstructive apnea 12/29/2013  . Adiposity 12/29/2013  . Absolute anemia 11/26/2013  . Benign essential HTN 11/26/2013  . History of artificial joint 11/26/2013  . Pure hypercholesterolemia 11/26/2013  . Disease of nail 11/26/2013   Renford Dills, SPT This entire session was performed under direct supervision and direction of a licensed therapist/therapist assistant . I have personally read, edited and approve of the note as written. Gorden Harms. Tortorici, PT, DPT [phone]   Tortorici,Ashley 07/05/2015, 4:00 PM  [place] MAIN [facility] SERVICES [address] [place], [place], [address] Phone: [phone]   Fax:  [phone]  Name: Clarabella Noser MRN: [id number] Date of Birth: 1941/01/22

## 2015-07-10 ENCOUNTER — Ambulatory Visit: Payer: Medicare Other

## 2015-07-10 DIAGNOSIS — R2681 Unsteadiness on feet: Secondary | ICD-10-CM | POA: Diagnosis not present

## 2015-07-10 DIAGNOSIS — R531 Weakness: Secondary | ICD-10-CM

## 2015-07-10 NOTE — Therapy (Signed)
[place] MAIN [facility] SERVICES [address] [place], [place], [address] Phone: [phone]   Fax:  [phone]  Physical Therapy Treatment  Patient Details  Name: Crystal Frederick MRN: [id number] Date of Birth: 22-Nov-1940 Referring Provider: Dr. Brigitte Pulse  Encounter Date: 07/10/2015      PT End of Session - 07/10/15 1526    Visit Number 8   Number of Visits 17   Date for PT Re-Evaluation 07/30/15   Authorization Type 3/10   PT Start Time 1430   PT Stop Time 1510   PT Time Calculation (min) 40 min   Equipment Utilized During Treatment Gait belt   Activity Tolerance Patient tolerated treatment well   Behavior During Therapy [facility] for tasks assessed/performed      Past Medical History  Diagnosis Date  . Hypertension   . Diabetes mellitus without complication ([place])   . Obesity   . Peripheral neuropathy ([place])   . DVT (deep venous thrombosis) ([place])   . Peripheral neuropathy ([place])   . Multinodular goiter   . Pure hypercholesterolemia   . Gout   . Arthritis   . Cancer ([place])   . Peripheral vascular disease ([place])   . Sleep apnea   . Vitamin D deficiency   . Thrombocytopenia [facility])     Past Surgical History  Procedure Laterality Date  . Mastectomy    . Breast surgery    . Joint replacement    . Wisdom tooth extraction    . Colonoscopy with propofol N/A 03/05/2015    Procedure: COLONOSCOPY WITH PROPOFOL;  Surgeon: Manya Silvas, MD;  Location: [facility] ENDOSCOPY;  Service: Endoscopy;  Laterality: N/A;    There were no vitals filed for this visit.  Visit Diagnosis:  Unsteadiness on feet  Weakness      Subjective Assessment - 07/10/15 1525    Subjective pt reports she is able to walk in her home more without "feeling for the walls"   Patient Stated Goals Pt would like to walk without falling down.     Currently in Pain? No/denies     Nustep L 3 x 3 min on charge warm up Leg press 100lbs 3x12, cues for pace Standing resisted  hip flexion, abduction and extension with yellow       Band x 10 each way Standing squat with yellow band around knees x 10, then modifed to having chair behind to aim for to promote proper technique. 2x10 Fwd step up , single UE support x 10 each leg  NMR: Toe taps on AIREX and 6in step 2x20 fwd 1x10 sideways pt required min A to correct LOB x 2  1 foot on step with trunk twists and vertical head nods 2x10 each bilaterally Standing NBOS, eyes open/closed 10s x 6 3 step SLS 2 min Pt requires CGA to min A for safety Pt requires min verbal and tactile cues for proper exercise performance                               PT Education - 07/10/15 1525    Education provided Yes   Education Details weight shift during single leg activities   Person(s) Educated Patient   Methods Explanation   Comprehension Verbalized understanding             PT Long Term Goals - 07/02/15 1821    PT LONG TERM GOAL #1   Title pt's Berg balance  score will improve by at least 6 points indicating decreased fall risk    Baseline 49/56 on eval (10/18); 53/56 on 11/14   Time 4   Period Weeks   Status On-going   PT LONG TERM GOAL #2   Title pt's 5x sit to stand will be less than 11 sec for average time for her age indicating improved functional LE    Baseline 16.62 sec on eval (10/18); 11.77 seconds on 11/14   Time 4   Period Weeks   Status On-going   PT LONG TERM GOAL #3   Title pt's gait speed will be greater than 0.8 m/s with LRAD for safe community ambulation    Baseline 0.72 m/s on eval (10/18) with no assistive device; 0.78 m/s on 11/14   Status On-going               Plan - 07/10/15 1526    Clinical Impression Statement pt did well with progressed LE strengthening exercises. she reported "feeling it" in appropriate LE muscles. pt had difficutly with balance exercises on compliant AIREX surface. pt would benefit from continued skilled PT services to furhter progress  strength and balance to reudce fall risk.    Pt will benefit from skilled therapeutic intervention in order to improve on the following deficits Decreased strength;Postural dysfunction;Decreased balance;Decreased activity tolerance   Rehab Potential Fair   PT Frequency 2x / week   PT Duration 4 weeks   PT Treatment/Interventions Biofeedback;Cryotherapy;Electrical Stimulation;Moist Heat;Therapeutic exercise;Therapeutic activities;Functional mobility training;Stair training;Gait training;Balance training;Neuromuscular re-education;Manual techniques;Vestibular   PT Next Visit Plan hip abduction strengthening and dynamic balance        Problem List Patient Active Problem List   Diagnosis Date Noted  . Bilateral breast cancer ([place]) 05/23/2015  . Headache, tension-type 02/23/2015  . Lacunar infarction ([place]) 12/21/2014  . Hay fever 11/24/2014  . Other long term (current) drug therapy 11/24/2014  . Avitaminosis D 05/20/2014  . Arthritis of knee, degenerative 05/20/2014  . Chronic painful diabetic neuropathy ([place]) 05/20/2014  . Type 2 diabetes mellitus ([place]) 04/13/2014  . Gout 04/13/2014  . 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 Obstructive apnea 12/29/2013  . Adiposity 12/29/2013  . Absolute anemia 11/26/2013  . Benign essential HTN 11/26/2013  . History of artificial joint 11/26/2013  . Pure hypercholesterolemia 11/26/2013  . Disease of nail 11/26/2013   Gorden Harms. Bishop Vanderwerf, PT, DPT [phone]  Crystal Frederick 07/10/2015, 3:28 PM  [place] MAIN [facility] SERVICES [address] [place], [place], [address] Phone: [phone]   Fax:  [phone]  Name: Crystal Frederick MRN: [id number] Date of Birth: [birth year]

## 2015-07-11 ENCOUNTER — Inpatient Hospital Stay (HOSPITAL_BASED_OUTPATIENT_CLINIC_OR_DEPARTMENT_OTHER): Payer: Medicare Other | Admitting: Hematology and Oncology

## 2015-07-11 VITALS — BP 114/55 | HR 53 | Temp 98.0°F | Wt 186.4 lb

## 2015-07-11 DIAGNOSIS — C50912 Malignant neoplasm of unspecified site of left female breast: Secondary | ICD-10-CM

## 2015-07-11 DIAGNOSIS — D649 Anemia, unspecified: Secondary | ICD-10-CM

## 2015-07-11 DIAGNOSIS — R51 Headache: Secondary | ICD-10-CM

## 2015-07-11 DIAGNOSIS — Z170 Estrogen receptor positive status [ER+]: Secondary | ICD-10-CM

## 2015-07-11 DIAGNOSIS — Z79811 Long term (current) use of aromatase inhibitors: Secondary | ICD-10-CM

## 2015-07-11 DIAGNOSIS — I739 Peripheral vascular disease, unspecified: Secondary | ICD-10-CM

## 2015-07-11 DIAGNOSIS — Z853 Personal history of malignant neoplasm of breast: Secondary | ICD-10-CM

## 2015-07-11 DIAGNOSIS — I10 Essential (primary) hypertension: Secondary | ICD-10-CM

## 2015-07-11 DIAGNOSIS — Z9013 Acquired absence of bilateral breasts and nipples: Secondary | ICD-10-CM

## 2015-07-11 DIAGNOSIS — D6489 Other specified anemias: Secondary | ICD-10-CM

## 2015-07-11 DIAGNOSIS — Z9221 Personal history of antineoplastic chemotherapy: Secondary | ICD-10-CM

## 2015-07-11 DIAGNOSIS — Z923 Personal history of irradiation: Secondary | ICD-10-CM

## 2015-07-11 DIAGNOSIS — E119 Type 2 diabetes mellitus without complications: Secondary | ICD-10-CM

## 2015-07-17 ENCOUNTER — Ambulatory Visit: Payer: Medicare Other

## 2015-07-17 DIAGNOSIS — R531 Weakness: Secondary | ICD-10-CM

## 2015-07-17 DIAGNOSIS — R2681 Unsteadiness on feet: Secondary | ICD-10-CM

## 2015-07-18 NOTE — Therapy (Signed)
[place] MAIN [facility] SERVICES [address] [place], [place], [address] Phone: [phone]   Fax:  [phone]  Physical Therapy Treatment  Patient Details  Name: Crystal Frederick MRN: [id number] Date of Birth: 14-Jun-1941 Referring Provider: Dr. Brigitte Pulse  Encounter Date: 07/17/2015      PT End of Session - 07/18/15 0824    Visit Number 9   Number of Visits 17   Date for PT Re-Evaluation 07/30/15   Authorization Type 4/10   PT Start Time 1435   PT Stop Time 1515   PT Time Calculation (min) 40 min   Equipment Utilized During Treatment Gait belt   Activity Tolerance Patient tolerated treatment well   Behavior During Therapy [facility] for tasks assessed/performed      Past Medical History  Diagnosis Date  . Hypertension   . Diabetes mellitus without complication ([place])   . Obesity   . Peripheral neuropathy ([place])   . DVT (deep venous thrombosis) ([place])   . Peripheral neuropathy ([place])   . Multinodular goiter   . Pure hypercholesterolemia   . Gout   . Arthritis   . Cancer ([place])   . Peripheral vascular disease ([place])   . Sleep apnea   . Vitamin D deficiency   . Thrombocytopenia [facility])     Past Surgical History  Procedure Laterality Date  . Mastectomy    . Breast surgery    . Joint replacement    . Wisdom tooth extraction    . Colonoscopy with propofol N/A 03/05/2015    Procedure: COLONOSCOPY WITH PROPOFOL;  Surgeon: Manya Silvas, MD;  Location: [facility] ENDOSCOPY;  Service: Endoscopy;  Laterality: N/A;    There were no vitals filed for this visit.  Visit Diagnosis:  Unsteadiness on feet  Weakness      Subjective Assessment - 07/18/15 0822    Subjective pt reports she was sore after last session. she reports no new falls    Patient Stated Goals Pt would like to walk without falling down.     Currently in Pain? No/denies   Pain Score 0-No pain      Octane trainer: L5 x 4 min with verbal cues to increase SPM Knee extension 2  plates 3x10 Hamstring curls 3 plates 3x10 Sit to stand with over head 2lb med ball press 2x10 Standing resisted hip flexion, abduction ,adduction and extension with  yellow      Band x 15 each way Pt requires min verbal and tactile cues for proper exercise performance    NMR: Standing on AIREX heel/toe raise x 20 Standing on AIREX with Eyes open/eyes closed weight shift side to side/front to back 1 min x 2 each Standing on AIREX wide BOS , eyes closed with vertical and horiz head turns 2x10  Walking on balance beam fwd/retro x 2 each Pt requires CGA to min A for safety                             PT Education - 07/18/15 0823    Education provided Yes   Education Details progression of therex   Person(s) Educated Patient   Methods Explanation   Comprehension Verbalized understanding             PT Long Term Goals - 07/02/15 1821    PT LONG TERM GOAL #1   Title pt's Berg balance score will improve by at least 6 points indicating decreased fall risk  Baseline 49/56 on eval (10/18); 53/56 on 11/14   Time 4   Period Weeks   Status On-going   PT LONG TERM GOAL #2   Title pt's 5x sit to stand will be less than 11 sec for average time for her age indicating improved functional LE    Baseline 16.62 sec on eval (10/18); 11.77 seconds on 11/14   Time 4   Period Weeks   Status On-going   PT LONG TERM GOAL #3   Title pt's gait speed will be greater than 0.8 m/s with LRAD for safe community ambulation    Baseline 0.72 m/s on eval (10/18) with no assistive device; 0.78 m/s on 11/14   Status On-going               Plan - 07/18/15 0825    Clinical Impression Statement pt did well with progression of strengthening activities. she reported leg fatigue after exercises. pt needs motivational cues for progressed balance exercises.    Pt will benefit from skilled therapeutic intervention in order to improve on the following deficits Decreased  strength;Postural dysfunction;Decreased balance;Decreased activity tolerance   Rehab Potential Fair   PT Frequency 2x / week   PT Duration 4 weeks   PT Treatment/Interventions Biofeedback;Cryotherapy;Electrical Stimulation;Moist Heat;Therapeutic exercise;Therapeutic activities;Functional mobility training;Stair training;Gait training;Balance training;Neuromuscular re-education;Manual techniques;Vestibular   PT Next Visit Plan hip abduction strengthening and dynamic balance        Problem List Patient Active Problem List   Diagnosis Date Noted  . Bilateral breast cancer ([place]) 05/23/2015  . Headache, tension-type 02/23/2015  . Lacunar infarction ([place]) 12/21/2014  . Hay fever 11/24/2014  . Other long term (current) drug therapy 11/24/2014  . Avitaminosis D 05/20/2014  . Arthritis of knee, degenerative 05/20/2014  . Chronic painful diabetic neuropathy ([place]) 05/20/2014  . Type 2 diabetes mellitus ([place]) 04/13/2014  . Gout 04/13/2014  . 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 Obstructive apnea 12/29/2013  . Adiposity 12/29/2013  . Absolute anemia 11/26/2013  . Benign essential HTN 11/26/2013  . History of artificial joint 11/26/2013  . Pure hypercholesterolemia 11/26/2013  . Disease of nail 11/26/2013   Gorden Harms. Liridona Mashaw, PT, DPT [phone]  Justun Anaya 07/18/2015, 8:27 AM  [place] MAIN [facility] SERVICES [address] [place], [place], [address] Phone: [phone]   Fax:  [phone]  Name: Crystal Frederick MRN: [id number] Date of Birth: 1941/01/01

## 2015-07-23 ENCOUNTER — Ambulatory Visit: Payer: Medicare Other | Attending: Neurology

## 2015-07-23 DIAGNOSIS — R2681 Unsteadiness on feet: Secondary | ICD-10-CM | POA: Diagnosis present

## 2015-07-23 DIAGNOSIS — R531 Weakness: Secondary | ICD-10-CM | POA: Diagnosis present

## 2015-07-23 NOTE — Therapy (Signed)
[place] MAIN [facility] SERVICES [address] [place], [place], [address] Phone: [phone]   Fax:  [phone]  Physical Therapy Treatment  Patient Details  Name: Crystal Frederick MRN: [id number] Date of Birth: [birth year] Referring Provider: Dr. Brigitte Pulse  Encounter Date: 07/23/2015      PT End of Session - 07/23/15 0940    Visit Number 10   Number of Visits 17   Date for PT Re-Evaluation 07/30/15   Authorization Type 5/10   PT Start Time 0922   PT Stop Time 1000   PT Time Calculation (min) 38 min   Equipment Utilized During Treatment Gait belt   Activity Tolerance Patient tolerated treatment well   Behavior During Therapy [facility] for tasks assessed/performed      Past Medical History  Diagnosis Date  . Hypertension   . Diabetes mellitus without complication ([place])   . Obesity   . Peripheral neuropathy ([place])   . DVT (deep venous thrombosis) ([place])   . Peripheral neuropathy ([place])   . Multinodular goiter   . Pure hypercholesterolemia   . Gout   . Arthritis   . Cancer ([place])   . Peripheral vascular disease ([place])   . Sleep apnea   . Vitamin D deficiency   . Thrombocytopenia [facility])     Past Surgical History  Procedure Laterality Date  . Mastectomy    . Breast surgery    . Joint replacement    . Wisdom tooth extraction    . Colonoscopy with propofol N/A 03/05/2015    Procedure: COLONOSCOPY WITH PROPOFOL;  Surgeon: Manya Silvas, MD;  Location: [facility] ENDOSCOPY;  Service: Endoscopy;  Laterality: N/A;    There were no vitals filed for this visit.  Visit Diagnosis:  Unsteadiness on feet  Weakness      Subjective Assessment - 07/23/15 0936    Subjective pt reports her knees are very sore from her OA.  she believes it is due to the weather.   Patient Stated Goals Pt would like to walk without falling down.     Currently in Pain? Yes   Pain Score --  pt unable to give numeric scale- she just reports its "really bad"   Pain  Location --  bilateral knees   Pain Orientation Left;Right   Pain Descriptors / Indicators Aching;Sharp     Therex: Nustep L1 x 2 min no charge warm up Tball HSC with bridge 2x10 Isometric ball crunch with Diaphragmatic breathing (initiating TA contraction) x 10- pt had HA following vitals assessed- were good. SLR 2x10 bilaterally sidelying hip abduction and adduction 2x10 each bilaterally Adductor ball squeeze + bridge 2x10 SAQ with ball under knee 2x10 each side Pt requires mod verbal and tactile cues for proper exercise performance                               PT Education - 07/23/15 0939    Education provided Yes   Education Details diaphragmatic breathing    Person(s) Educated Patient   Methods Explanation   Comprehension Verbalized understanding             PT Long Term Goals - 07/02/15 1821    PT LONG TERM GOAL #1   Title pt's Berg balance score will improve by at least 6 points indicating decreased fall risk    Baseline 49/56 on eval (10/18); 53/56 on 11/14   Time 4   Period Weeks  Status On-going   PT LONG TERM GOAL #2   Title pt's 5x sit to stand will be less than 11 sec for average time for her age indicating improved functional LE    Baseline 16.62 sec on eval (10/18); 11.77 seconds on 11/14   Time 4   Period Weeks   Status On-going   PT LONG TERM GOAL #3   Title pt's gait speed will be greater than 0.8 m/s with LRAD for safe community ambulation    Baseline 0.72 m/s on eval (10/18) with no assistive device; 0.78 m/s on 11/14   Status On-going               Plan - 07/23/15 0940    Clinical Impression Statement PT modified treatment today to account for pts increased knee pain from Oa flare up. pt had sudden headache during isometric ball crunch despite performing diaphragmatic breathing. BP was good at 136/67mmHg and Hr at 68 BPM. HA eased with rest. focused on core and LE strengthening in non weight baring position in  todays session.    Pt will benefit from skilled therapeutic intervention in order to improve on the following deficits Decreased strength;Postural dysfunction;Decreased balance;Decreased activity tolerance   Rehab Potential Fair   PT Frequency 2x / week   PT Duration 4 weeks   PT Treatment/Interventions Biofeedback;Cryotherapy;Electrical Stimulation;Moist Heat;Therapeutic exercise;Therapeutic activities;Functional mobility training;Stair training;Gait training;Balance training;Neuromuscular re-education;Manual techniques;Vestibular   PT Next Visit Plan hip abduction strengthening and dynamic balance        Problem List Patient Active Problem List   Diagnosis Date Noted  . Bilateral breast cancer ([place]) 05/23/2015  . Headache, tension-type 02/23/2015  . Lacunar infarction ([place]) 12/21/2014  . Hay fever 11/24/2014  . Other long term (current) drug therapy 11/24/2014  . Avitaminosis D 05/20/2014  . Arthritis of knee, degenerative 05/20/2014  . Chronic painful diabetic neuropathy ([place]) 05/20/2014  . Type 2 diabetes mellitus ([place]) 04/13/2014  . Gout 04/13/2014  . 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 Obstructive apnea 12/29/2013  . Adiposity 12/29/2013  . Absolute anemia 11/26/2013  . Benign essential HTN 11/26/2013  . History of artificial joint 11/26/2013  . Pure hypercholesterolemia 11/26/2013  . Disease of nail 11/26/2013   Gorden Harms. Jheri Mitter, PT, DPT [phone]  [place] 07/23/2015, 9:44 AM  [place] MAIN [facility] SERVICES [address] [place], [place], [address] Phone: [phone]   Fax:  [phone]  Name: Crystal Frederick MRN: :[id number] Date of Birth: 1940/12/01

## 2015-07-25 ENCOUNTER — Ambulatory Visit: Payer: Medicare Other

## 2015-07-25 DIAGNOSIS — R2681 Unsteadiness on feet: Secondary | ICD-10-CM

## 2015-07-25 DIAGNOSIS — R531 Weakness: Secondary | ICD-10-CM

## 2015-07-25 NOTE — Therapy (Signed)
[place] MAIN [facility] SERVICES [address] [place], [place], [address] Phone: [phone]   Fax:  [phone]  Physical Therapy Treatment  Patient Details  Name: Crystal Frederick Frederick MRN: [id number] Date of Birth: 1941-04-22 Referring Provider: Dr. Brigitte Pulse  Encounter Date: 07/25/2015      PT End of Session - 07/25/15 1008    Visit Number 11   Number of Visits 17   Date for PT Re-Evaluation 07/30/15   Authorization Type 5/10   PT Start Time 0916   PT Stop Time 0955   PT Time Calculation (min) 39 min   Equipment Utilized During Treatment Gait belt   Activity Tolerance Patient tolerated treatment well   Behavior During Therapy [facility] for tasks assessed/performed      Past Medical History  Diagnosis Date  . Hypertension   . Diabetes mellitus without complication (Crystal Frederick Frederick)   . Obesity   . Peripheral neuropathy (Crystal Frederick Frederick)   . DVT (deep venous thrombosis) ([place])   . Peripheral neuropathy (Crystal Frederick Frederick)   . Multinodular goiter   . Pure hypercholesterolemia   . Gout   . Arthritis   . Cancer (Crystal Frederick)   . Peripheral vascular disease (Crystal Frederick Frederick)   . Sleep apnea   . Vitamin D deficiency   . Thrombocytopenia [facility])     Past Surgical History  Procedure Laterality Date  . Mastectomy    . Breast surgery    . Joint replacement    . Wisdom tooth extraction    . Colonoscopy with propofol N/A 03/05/2015    Procedure: COLONOSCOPY WITH PROPOFOL;  Surgeon: Manya Silvas, MD;  Location: [facility] ENDOSCOPY;  Service: Endoscopy;  Laterality: N/A;    There were no vitals filed for this visit.  Visit Diagnosis:  Unsteadiness on feet  Weakness      Subjective Assessment - 07/25/15 1003    Subjective pt reports she feels confident with her HEP and will be able to maintain progress on her own.    Patient Stated Goals Pt would like to walk without falling down.     Currently in Pain? Yes   Pain Score 5    Pain Location --  L knee      Therex: Standing resisted hip  flexion, abduction ,adduction and extension with  7lbs on cable column x 10 on each side  Side steps with yellow band x 5 laps in // bars  Monster walk with yellow band x 5 laps in // bars  3 step SLS hold x 2 min LAQ with ball squeeze 2x10 Adductor squeeze x 20 Standing NBOS - EO/EC 10s x 10 Standing NBOS EC with head turns 2x10 Pt requires min verbal and tactile cues for proper exercise performance   pt requires CGA for safety on balance exercises                              PT Education - 07/25/15 1008    Education provided Yes   Education Details continuing HEP for strength and balance.    Person(s) Educated Patient   Methods Explanation   Comprehension Verbalized understanding             PT Long Term Goals - 07/25/15 1012    PT LONG TERM GOAL #1   Title pt's Berg balance score will improve by at least 6 points indicating decreased fall risk    Baseline 49/56 on eval (10/18); 53/56 on 11/14  Time 4   Period Weeks   Status Partially Met   PT LONG TERM GOAL #2   Title pt's 5x sit to stand will be less than 11 sec for average time for her age indicating improved functional LE    Baseline 16.62 sec on eval (10/18); 11.77 seconds on 11/14   Time 4   Period Weeks   Status Achieved   PT LONG TERM GOAL #3   Title pt's gait speed will be greater than 0.8 m/s with LRAD for safe community ambulation    Baseline 0.72 m/s on eval (10/18) with no assistive device; 0.78 m/s on 11/14   Status Achieved               Plan - 2015-08-23 1008    Clinical Impression Statement pt has achived majority of PT goals at this time and is independent with HEP for continued strengthening. pt will beDC from PT at this time as skilled services are no longer needed. pt encouraged to call with questions or concerns and contact her doctor ifher balance worsens.    Pt will benefit from skilled therapeutic intervention in order to improve on the following deficits  Decreased strength;Postural dysfunction;Decreased balance;Decreased activity tolerance   Rehab Potential Fair   PT Frequency 2x / week   PT Duration 4 weeks   PT Treatment/Interventions Biofeedback;Cryotherapy;Electrical Stimulation;Moist Heat;Therapeutic exercise;Therapeutic activities;Functional mobility training;Stair training;Gait training;Balance training;Neuromuscular re-education;Manual techniques;Vestibular   PT Next Visit Plan hip abduction strengthening and dynamic balance          G-Codes - Aug 23, 2015 1018    Functional Assessment Tool Used history, clinical judgmenent, outcome measures    Functional Limitation Mobility: Walking and moving around   Mobility: Walking and Moving Around Current Status [phone]) At least 20 percent but less than 40 percent impaired, limited or restricted   Mobility: Walking and Moving Around Goal Status [phone]) At least 20 percent but less than 40 percent impaired, limited or restricted   Mobility: Walking and Moving Around Discharge Status [phone]) At least 20 percent but less than 40 percent impaired, limited or restricted      Problem List Patient Active Problem List   Diagnosis Date Noted  . Bilateral breast cancer (Crystal Frederick) 05/23/2015  . Headache, tension-type 02/23/2015  . Lacunar infarction (Crystal Frederick) 12/21/2014  . Hay fever 11/24/2014  . Other long term (current) drug therapy 11/24/2014  . Avitaminosis D 05/20/2014  . Arthritis of knee, degenerative 05/20/2014  . Chronic painful diabetic neuropathy (Crystal Frederick Frederick) 05/20/2014  . Type 2 diabetes mellitus (Crystal Frederick) 04/13/2014  . Gout 04/13/2014  . Fibrosing alveolitis (Crystal Frederick Frederick) 12/29/2013  . Obstructive apnea 12/29/2013  . Adiposity 12/29/2013  . Absolute anemia 11/26/2013  . Benign essential HTN 11/26/2013  . History of artificial joint 11/26/2013  . Pure hypercholesterolemia 11/26/2013  . Disease of nail 11/26/2013   Crystal Frederick Frederick. Crystal Frederick Frederick, PT, DPT [phone]  Crystal Frederick Frederick August 23, 2015, 10:19 AM  [place] MAIN [facility] SERVICES [address] [place], [place], [address] Phone: [phone]   Fax:  [phone]  Name: Crystal Frederick Frederick MRN: [id number] Date of Birth: [birth year]

## 2015-08-03 ENCOUNTER — Other Ambulatory Visit
Admission: RE | Admit: 2015-08-03 | Discharge: 2015-08-03 | Disposition: A | Discharge status: Home / Self Care | Payer: Medicare Other | Source: Other Acute Inpatient Hospital | Attending: Otolaryngology | Admitting: Otolaryngology

## 2015-08-03 DIAGNOSIS — E039 Hypothyroidism, unspecified: Secondary | ICD-10-CM | POA: Insufficient documentation

## 2015-08-03 LAB — TSH: TSH: 1.543 u[IU]/mL (ref 0.350–4.500)

## 2015-08-03 LAB — T4, FREE: FREE T4: 0.89 ng/dL (ref 0.61–1.12)

## 2015-08-04 LAB — T3, FREE: T3, Free: 2.2 pg/mL (ref 2.0–4.4)

## 2015-09-30 ENCOUNTER — Encounter: Payer: Self-pay | Admitting: Hematology and Oncology

## 2015-09-30 NOTE — Progress Notes (Signed)
[facility] day:  07/11/2015  Chief Complaint: Crystal Frederick is a 75 y.o. female with a history of bilateral breast cancer on Arimidex who is seen for review of anemia workup.  HPI: The patient was last seen in the medical oncology clinic on 06/20/2015.  At that time, interval bone density was reviewed.  Bone density revealed osteoporosis with a T score of -3.2 in the forearm.  She was on Fosamax weekly.  We discussed calcium and vitamin D.    She had a new normocytic anemia.  She noted a history of anemia for "years".  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  Colonoscopy on 03/05/2015 was "okay". She denied any melena or hematochezia.  She ate meat about 3-4 times a week. She denied any pica.  Labs on 06/20/2015 included a hematocrit 37.2, hemoglobin 12.1, MCV 89.7, platelets 178,000, white count 8900.  Reticulocyte count was 1.2%.  Ferritin was 173. B12 was 457. Folate was 41.0. TSH was 1.981.   During the interim, she denies any new issues or problems.  She denies any melena or hematochezia.  Past Medical History  Diagnosis Date  . Hypertension   . Diabetes mellitus without complication ([place])   . Obesity   . Peripheral neuropathy ([place])   . DVT (deep venous thrombosis) ([place])   . Peripheral neuropathy ([place])   . Multinodular goiter   . Pure hypercholesterolemia   . Gout   . Arthritis   . Cancer ([place])   . Peripheral vascular disease ([place])   . Sleep apnea   . Vitamin D deficiency   . Thrombocytopenia [facility])     Past Surgical History  Procedure Laterality Date  . Mastectomy    . Breast surgery    . Joint replacement    . Wisdom tooth extraction    . Colonoscopy with propofol N/A 03/05/2015    Procedure: COLONOSCOPY WITH PROPOFOL;  Surgeon: Manya Silvas, MD;  Location: [facility] ENDOSCOPY;  Service: Endoscopy;  Laterality: N/A;    Family History  Problem Relation Age of Onset  . Hypertension Mother   . Cancer  Mother     Breast  . Cancer Father     Leukemia  . Cancer Maternal Aunt     UNsure of type    Social History:  reports that she has quit smoking. She has never used smokeless tobacco. She reports that she does not drink alcohol or use illicit drugs.  The patient is alone today.  Allergies:  Allergies  Allergen Reactions  . Celebrex [Celecoxib] Other (See Comments)  . Metformin And Related Diarrhea  . Tramadol Nausea And Vomiting  . Voltaren [Diclofenac Sodium] Nausea And Vomiting    Current Medications: Current Outpatient Prescriptions  Medication Sig Dispense Refill  . acetaminophen (TYLENOL) 500 MG tablet Take 1,000 mg by mouth daily.    Marland Kitchen albuterol (PROVENTIL HFA;VENTOLIN HFA) 108 (90 BASE) MCG/ACT inhaler Inhale 2 puffs into the lungs every 6 (six) hours as needed for wheezing or shortness of breath.    Marland Kitchen alendronate (FOSAMAX) 70 MG tablet Take by mouth.    . Alpha-Lipoic Acid 200 MG TABS Take 200 mg by mouth daily.    Marland Kitchen anastrozole (ARIMIDEX) 1 MG tablet TAKE ONE TABLET BY MOUTH ONCE DAILY 30 tablet 0  . aspirin EC 81 MG tablet Take 81 mg by mouth daily.    Marland Kitchen atorvastatin (LIPITOR) 20 MG tablet Take 20 mg by mouth daily.    . baclofen (LIORESAL)  10 MG tablet Take 10 mg by mouth 2 (two) times daily.    . benzonatate (TESSALON) 100 MG capsule Take by mouth.    . bisoprolol (ZEBETA) 5 MG tablet Take 5 mg by mouth daily.    . calcium carbonate (OS-CAL) 600 MG TABS tablet Take 600 mg by mouth 2 (two) times daily with a meal.    . ergocalciferol (VITAMIN D2) 50000 UNITS capsule Take 50,000 Units by mouth once a week.    . fluticasone (FLONASE) 50 MCG/ACT nasal spray Place 2 sprays into both nostrils daily.    . furosemide (LASIX) 40 MG tablet Take 40 mg by mouth daily.    Marland Kitchen gabapentin (NEURONTIN) 300 MG capsule Take 300 mg by mouth 3 (three) times daily.    Marland Kitchen glimepiride (AMARYL) 4 MG tablet Take 4 mg by mouth daily with breakfast.    . glucose blood test strip 1 each by Other  route as needed for other. Use as instructed    . levothyroxine (SYNTHROID, LEVOTHROID) 50 MCG tablet Take 50 mcg by mouth daily before breakfast.    . losartan (COZAAR) 100 MG tablet Take 100 mg by mouth daily.    . magnesium oxide (MAG-OX) 400 MG tablet Take by mouth.    . metFORMIN (GLUCOPHAGE-XR) 500 MG 24 hr tablet Take 500 mg by mouth daily with breakfast.    . MULTIPLE VITAMIN-FOLIC ACID PO Take 1 tablet by mouth daily.    Marland Kitchen omeprazole (PRILOSEC) 20 MG capsule Take 20 mg by mouth daily.    . sitaGLIPtin (JANUVIA) 100 MG tablet Take by mouth.    . vitamin C (ASCORBIC ACID) 500 MG tablet Take 500 mg by mouth daily.     No current facility-administered medications for this visit.    Review of Systems:  GENERAL:  Feels "ok".  No fevers, sweats or weight loss. PERFORMANCE STATUS (ECOG):  1 HEENT:  No visual changes, runny nose, sore throat, mouth sores or tenderness.  No dental follow-up. Lungs: No shortness of breath or cough.  No hemoptysis. Cardiac:  No chest pain, palpitations, orthopnea, or PND. GI:  No nausea, vomiting, diarrhea, constipation, melena or hematochezia.  Colonoscopy 03/05/2015. GU:  No urgency, frequency, dysuria, or hematuria. Musculoskeletal:  Aches all over. No muscle tenderness. Extremities:  No pain or swelling. Skin:  No rashes or skin changes. Neuro:  No headache, numbness or weakness, balance or coordination issues. Endocrine:  Diabetes.  Issues with Januvia.  Thyroid disease on Synthroid.  No hot flashes or night sweats. Psych:  No mood changes, depression or anxiety. Pain:  No focal pain. Review of systems:  All other systems reviewed and found to be negative.  Physical Exam: Blood pressure 114/55, pulse 53, temperature 98 F (36.7 C), temperature source Tympanic, weight 186 lb 6.4 oz (84.55 kg). GENERAL:  Well developed, well nourished, sitting comfortably in the exam room in no acute distress.  She has a cane at her side. MENTAL STATUS:  Alert and  oriented to person, place and time. HEAD:  Wearing a beige cap. Thin short dark hair.  Normocephalic, atraumatic, face symmetric, no Cushingoid features. EYES:  Brown eyes.  No conjunctivitis or scleral icterus. NEUROLOGICAL: Unremarkable. PSYCH:  Appropriate.  No visits with results within 3 Day(s) from this visit. Latest known visit with results is:  Office Visit on 06/20/2015  Component Date Value Ref Range Status  . WBC 06/20/2015 8.9  3.6 - 11.0 K/uL Final  . RBC 06/20/2015 4.15  3.80 - 5.20  MIL/uL Final  . Hemoglobin 06/20/2015 12.1  12.0 - 16.0 g/dL Final  . HCT 06/20/2015 37.2  35.0 - 47.0 % Final  . MCV 06/20/2015 89.7  80.0 - 100.0 fL Final  . MCH 06/20/2015 29.1  26.0 - 34.0 pg Final  . MCHC 06/20/2015 32.4  32.0 - 36.0 g/dL Final  . RDW 06/20/2015 14.9* 11.5 - 14.5 % Final  . Platelets 06/20/2015 178  150 - 440 K/uL Final  . Ferritin 06/20/2015 173  11 - 307 ng/mL Final  . Iron 06/20/2015 33  28 - 170 ug/dL Final  . TIBC 06/20/2015 270  250 - 450 ug/dL Final  . Saturation Ratios 06/20/2015 12  10.4 - 31.8 % Final  . UIBC 06/20/2015 237   Final  . Vitamin B-12 06/20/2015 457  180 - 914 pg/mL Final   Comment: (NOTE) This assay is not validated for testing neonatal or myeloproliferative syndrome specimens for Vitamin B12 levels. Performed at [facility]   . Folate 06/20/2015 41.0  >5.9 ng/mL Final  . TSH 06/20/2015 1.981  0.350 - 4.500 uIU/mL Final  . Retic Ct Pct 06/20/2015 1.2  0.4 - 3.1 % Final  . RBC. 06/20/2015 4.15  3.80 - 5.20 MIL/uL Final  . Retic Count, Manual 06/20/2015 49.8  19.0 - 183.0 K/uL Final    Assessment:  Crystal Frederick is a 75 y.o. 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  She presented in 04/1998 with a 4 x 2.2 x 2.2 cm right breast cancer.  She underwent lumpectomy.  Tumor was ER, PR, and Her2/neu negative.  She received adjuvant adriamycin and Cytoxan (AC)  followed by Taxol.  She received radiation.  She developed recurrent disease in 12/2000.  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  She underwent modified radical mastectomy.  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  She developed left breast cancer in 11/2009.  She underwent mastectomy in 12/2009.  Tumor was ER positive, PR positive, and  Her2/neu negative.  Oncotype score was 33 (high).  She received 6 cycles of Taxotere and Cytoxan (TC) from 01/29/2010 - 05/15/2010.  She completed left chest wall radiation in 07/2010.  Arimidex started in 07/2010.  She is tolerating it well.  CA 27-29 was 30.9 (normal) on 05/23/2015.  She fell in 10/2013 and sustained an orbital fracture.  CT face revealed a subpleural density in both upper lobes felt secondary to scarring.  She is being followed by Dr. Raul Del of pulmonary medicine.   She had an intermittent headache and a pressure sensation on the right side of her head and balance problem for 4-5 months.  Head MRI on 11/30/2014 revealed no metastatic disease. She had small areas of hemosiderin both anterior frontal lobes and also the right posterior temporal lobe sequela of trauma. There was chronic left sided MCA infarct. Cervical spine MRI on 11/30/2014 revealed subtle signal changes along the ventral C4 and C5-C6 level suspicious for posterior anterior ligamentous injury. There was widespread cervical spine disc and endplate degeneration. There was no evidence of metastatic disease.  Bone density study on 06/04/2015 revealed a osteoporosis with a T score of -3.2 in the forearm, -2.3 in the left femoral neck, and -0.9 in the left hip.  She is on Fosamax weekly.  She has a history of a normocytic anemia (HCT was 40.4 on 11/22/2014 and 34.7 on 05/23/2015).  She has a history of Celebrex-induced gastritis/ulcer.  Per patient report,  colonoscopy on 03/05/2015 was "okay". She denies any melena or  hematochezia. She eats meat about 3-4 times a week. She denies any pica.  Work-up on 06/20/2015 revealed a hematocrit of 37.2 and normal reticulocyte count, ferritin, B12, folate, and TSH.   Symptomatically, she feels achy.  Exam reveals post-operative changes. She denies any melena, hematochezia, hematuria, or vaginal bleeding.  Plan: 1.  Review labs from last visit.  Discuss resolusion of anemia. 2.  RTC on 11/28/2015 for MD assess and labs (CBC with diff, CMP, CA27.29, ferritin).   Lequita Asal, MD  07/11/2015

## 2015-11-28 ENCOUNTER — Other Ambulatory Visit: Payer: Medicare Other

## 2015-11-28 ENCOUNTER — Ambulatory Visit: Payer: Medicare Other | Admitting: Hematology and Oncology

## 2015-11-28 ENCOUNTER — Other Ambulatory Visit: Payer: Self-pay

## 2015-11-28 DIAGNOSIS — C50911 Malignant neoplasm of unspecified site of right female breast: Secondary | ICD-10-CM

## 2015-11-28 DIAGNOSIS — C50912 Malignant neoplasm of unspecified site of left female breast: Principal | ICD-10-CM

## 2015-12-12 ENCOUNTER — Other Ambulatory Visit: Payer: Self-pay | Admitting: Hematology and Oncology

## 2015-12-12 ENCOUNTER — Inpatient Hospital Stay: Payer: Medicare Other | Attending: Hematology and Oncology

## 2015-12-12 ENCOUNTER — Inpatient Hospital Stay (HOSPITAL_BASED_OUTPATIENT_CLINIC_OR_DEPARTMENT_OTHER): Payer: Medicare Other | Admitting: Hematology and Oncology

## 2015-12-12 VITALS — BP 134/78 | HR 54 | Temp 97.0°F | Wt 198.9 lb

## 2015-12-12 DIAGNOSIS — M79641 Pain in right hand: Secondary | ICD-10-CM

## 2015-12-12 DIAGNOSIS — Z7982 Long term (current) use of aspirin: Secondary | ICD-10-CM

## 2015-12-12 DIAGNOSIS — Z170 Estrogen receptor positive status [ER+]: Secondary | ICD-10-CM | POA: Insufficient documentation

## 2015-12-12 DIAGNOSIS — G4730 Sleep apnea, unspecified: Secondary | ICD-10-CM | POA: Diagnosis not present

## 2015-12-12 DIAGNOSIS — Z79811 Long term (current) use of aromatase inhibitors: Secondary | ICD-10-CM | POA: Diagnosis not present

## 2015-12-12 DIAGNOSIS — E7800 Pure hypercholesterolemia, unspecified: Secondary | ICD-10-CM | POA: Insufficient documentation

## 2015-12-12 DIAGNOSIS — Z806 Family history of leukemia: Secondary | ICD-10-CM | POA: Insufficient documentation

## 2015-12-12 DIAGNOSIS — Z7984 Long term (current) use of oral hypoglycemic drugs: Secondary | ICD-10-CM

## 2015-12-12 DIAGNOSIS — I10 Essential (primary) hypertension: Secondary | ICD-10-CM

## 2015-12-12 DIAGNOSIS — M109 Gout, unspecified: Secondary | ICD-10-CM | POA: Diagnosis not present

## 2015-12-12 DIAGNOSIS — C50911 Malignant neoplasm of unspecified site of right female breast: Secondary | ICD-10-CM

## 2015-12-12 DIAGNOSIS — D649 Anemia, unspecified: Secondary | ICD-10-CM

## 2015-12-12 DIAGNOSIS — Z9221 Personal history of antineoplastic chemotherapy: Secondary | ICD-10-CM | POA: Diagnosis not present

## 2015-12-12 DIAGNOSIS — M810 Age-related osteoporosis without current pathological fracture: Secondary | ICD-10-CM

## 2015-12-12 DIAGNOSIS — E119 Type 2 diabetes mellitus without complications: Secondary | ICD-10-CM | POA: Diagnosis not present

## 2015-12-12 DIAGNOSIS — G629 Polyneuropathy, unspecified: Secondary | ICD-10-CM | POA: Diagnosis not present

## 2015-12-12 DIAGNOSIS — Z853 Personal history of malignant neoplasm of breast: Secondary | ICD-10-CM

## 2015-12-12 DIAGNOSIS — I739 Peripheral vascular disease, unspecified: Secondary | ICD-10-CM | POA: Insufficient documentation

## 2015-12-12 DIAGNOSIS — Z9013 Acquired absence of bilateral breasts and nipples: Secondary | ICD-10-CM

## 2015-12-12 DIAGNOSIS — M1990 Unspecified osteoarthritis, unspecified site: Secondary | ICD-10-CM | POA: Diagnosis not present

## 2015-12-12 DIAGNOSIS — Z79899 Other long term (current) drug therapy: Secondary | ICD-10-CM | POA: Insufficient documentation

## 2015-12-12 DIAGNOSIS — E559 Vitamin D deficiency, unspecified: Secondary | ICD-10-CM | POA: Insufficient documentation

## 2015-12-12 DIAGNOSIS — Z87891 Personal history of nicotine dependence: Secondary | ICD-10-CM

## 2015-12-12 DIAGNOSIS — C50912 Malignant neoplasm of unspecified site of left female breast: Secondary | ICD-10-CM | POA: Insufficient documentation

## 2015-12-12 DIAGNOSIS — M79642 Pain in left hand: Secondary | ICD-10-CM | POA: Insufficient documentation

## 2015-12-12 DIAGNOSIS — Z803 Family history of malignant neoplasm of breast: Secondary | ICD-10-CM | POA: Insufficient documentation

## 2015-12-12 LAB — CBC WITH DIFFERENTIAL/PLATELET
Basophils Absolute: 0.1 10*3/uL (ref 0–0.1)
Basophils Relative: 1 %
Eosinophils Absolute: 0.8 10*3/uL — ABNORMAL HIGH (ref 0–0.7)
Eosinophils Relative: 10 %
HCT: 36.7 % (ref 35.0–47.0)
Hemoglobin: 12 g/dL (ref 12.0–16.0)
Lymphocytes Relative: 21 %
Lymphs Abs: 1.7 10*3/uL (ref 1.0–3.6)
MCH: 28.9 pg (ref 26.0–34.0)
MCHC: 32.6 g/dL (ref 32.0–36.0)
MCV: 88.7 fL (ref 80.0–100.0)
Monocytes Absolute: 0.7 10*3/uL (ref 0.2–0.9)
Monocytes Relative: 9 %
Neutro Abs: 5 10*3/uL (ref 1.4–6.5)
Neutrophils Relative %: 59 %
Platelets: 165 10*3/uL (ref 150–440)
RBC: 4.13 MIL/uL (ref 3.80–5.20)
RDW: 15.4 % — ABNORMAL HIGH (ref 11.5–14.5)
WBC: 8.4 10*3/uL (ref 3.6–11.0)

## 2015-12-12 LAB — COMPREHENSIVE METABOLIC PANEL
ALT: 11 U/L — ABNORMAL LOW (ref 14–54)
AST: 17 U/L (ref 15–41)
Albumin: 3.9 g/dL (ref 3.5–5.0)
Alkaline Phosphatase: 57 U/L (ref 38–126)
Anion gap: 3 — ABNORMAL LOW (ref 5–15)
BUN: 23 mg/dL — ABNORMAL HIGH (ref 6–20)
CO2: 29 mmol/L (ref 22–32)
Calcium: 9 mg/dL (ref 8.9–10.3)
Chloride: 104 mmol/L (ref 101–111)
Creatinine, Ser: 1.06 mg/dL — ABNORMAL HIGH (ref 0.44–1.00)
GFR calc Af Amer: 57 mL/min — ABNORMAL LOW (ref >60)
GFR calc non Af Amer: 49 mL/min — ABNORMAL LOW (ref >60)
Glucose, Bld: 139 mg/dL — ABNORMAL HIGH (ref 65–99)
Potassium: 4.5 mmol/L (ref 3.5–5.1)
Sodium: 136 mmol/L (ref 135–145)
Total Bilirubin: 0.6 mg/dL (ref 0.3–1.2)
Total Protein: 7.6 g/dL (ref 6.5–8.1)

## 2015-12-12 LAB — FERRITIN: Ferritin: 76 ng/mL (ref 11–307)

## 2015-12-12 MED ORDER — ANASTROZOLE 1 MG PO TABS: 1.0000 mg | ORAL_TABLET | Freq: Every day | ORAL | Status: AC

## 2015-12-12 NOTE — Progress Notes (Signed)
[facility] day:  12/12/2015   Chief Complaint: Crystal Frederick is a 75 y.o. female with a history of bilateral breast cancer on Arimidex who is seen for 5 month assessment.  HPI: The patient was last seen in the medical oncology clinic on 07/11/2015.  At that time, she felt achy.  Exam revealed post-operative changes. She denied any melena, hematochezia, hematuria, or vaginal bleeding.  Hematocrit had improved from 34.7 to 37.2.  Additional labs were normal.   During the interim, she has done well.  She denies any breast concerns. She notes trouble with her hands described as sharp pains (right > left).  She states that she has been referred to a specialist by Dr. Raechel Ache.  Regarding her Armidex, she states that she ran out "awhile ago".  Five years of adjuvant hormonal therapy completed 07/2015.   Past Medical History  Diagnosis Date  . Hypertension   . Diabetes mellitus without complication ([place])   . Obesity   . Peripheral neuropathy ([place])   . DVT (deep venous thrombosis) ([place])   . Peripheral neuropathy ([place])   . Multinodular goiter   . Pure hypercholesterolemia   . Gout   . Arthritis   . Cancer ([place])   . Peripheral vascular disease ([place])   . Sleep apnea   . Vitamin D deficiency   . Thrombocytopenia [facility])     Past Surgical History  Procedure Laterality Date  . Mastectomy    . Breast surgery    . Joint replacement    . Wisdom tooth extraction    . Colonoscopy with propofol N/A 03/05/2015    Procedure: COLONOSCOPY WITH PROPOFOL;  Surgeon: Manya Silvas, MD;  Location: [facility] ENDOSCOPY;  Service: Endoscopy;  Laterality: N/A;    Family History  Problem Relation Age of Onset  . Hypertension Mother   . Cancer Mother     Breast  . Cancer Father     Leukemia  . Cancer Maternal Aunt     UNsure of type    Social History:  reports that she has quit smoking. She has never used smokeless tobacco. She reports that she does not  drink alcohol or use illicit drugs.  The patient is alone today.  Allergies:  Allergies  Allergen Reactions  . Celebrex [Celecoxib] Other (See Comments)  . Metformin And Related Diarrhea  . Tramadol Nausea And Vomiting  . Voltaren [Diclofenac Sodium] Nausea And Vomiting    Current Medications: Current Outpatient Prescriptions  Medication Sig Dispense Refill  . acetaminophen (TYLENOL) 500 MG tablet Take 1,000 mg by mouth daily.    Marland Kitchen albuterol (PROVENTIL HFA;VENTOLIN HFA) 108 (90 BASE) MCG/ACT inhaler Inhale 2 puffs into the lungs every 6 (six) hours as needed for wheezing or shortness of breath.    . Alpha-Lipoic Acid 200 MG TABS Take 200 mg by mouth daily.    Marland Kitchen anastrozole (ARIMIDEX) 1 MG tablet TAKE ONE TABLET BY MOUTH ONCE DAILY 30 tablet 0  . aspirin EC 81 MG tablet Take 81 mg by mouth daily.    Marland Kitchen atorvastatin (LIPITOR) 20 MG tablet Take 20 mg by mouth daily.    . baclofen (LIORESAL) 10 MG tablet Take 10 mg by mouth 2 (two) times daily.    . benzonatate (TESSALON) 100 MG capsule Take by mouth.    . bisoprolol (ZEBETA) 5 MG tablet Take 5 mg by mouth daily.    . calcium carbonate (OS-CAL) 600 MG TABS tablet Take 600 mg  by mouth 2 (two) times daily with a meal.    . ergocalciferol (VITAMIN D2) 50000 UNITS capsule Take 50,000 Units by mouth once a week.    . fluticasone (FLONASE) 50 MCG/ACT nasal spray Place 2 sprays into both nostrils daily.    . furosemide (LASIX) 40 MG tablet Take 40 mg by mouth daily.    Marland Kitchen gabapentin (NEURONTIN) 300 MG capsule Take 300 mg by mouth 3 (three) times daily.    Marland Kitchen glimepiride (AMARYL) 4 MG tablet Take 4 mg by mouth daily with breakfast.    . glucose blood test strip 1 each by Other route as needed for other. Use as instructed    . levothyroxine (SYNTHROID, LEVOTHROID) 50 MCG tablet Take 50 mcg by mouth daily before breakfast.    . losartan (COZAAR) 100 MG tablet Take 100 mg by mouth daily.    . magnesium oxide (MAG-OX) 400 MG tablet Take by mouth.    .  metFORMIN (GLUCOPHAGE-XR) 500 MG 24 hr tablet Take 500 mg by mouth daily with breakfast.    . MULTIPLE VITAMIN-FOLIC ACID PO Take 1 tablet by mouth daily.    Marland Kitchen omeprazole (PRILOSEC) 20 MG capsule Take 20 mg by mouth daily.    . sitaGLIPtin (JANUVIA) 100 MG tablet Take by mouth.    . vitamin C (ASCORBIC ACID) 500 MG tablet Take 500 mg by mouth daily.    Marland Kitchen alendronate (FOSAMAX) 70 MG tablet Take by mouth.     No current facility-administered medications for this visit.    Review of Systems:  GENERAL:  Feels "ok".  No fevers or sweats.  Weight up 13 pounds. PERFORMANCE STATUS (ECOG):  1 HEENT:  No visual changes, runny nose, sore throat, mouth sores or tenderness.  Lungs: No shortness of breath or cough.  No hemoptysis. Cardiac:  No chest pain, palpitations, orthopnea, or PND. GI:  No nausea, vomiting, diarrhea, constipation, melena or hematochezia.  Colonoscopy 03/05/2015. GU:  No urgency, frequency, dysuria, or hematuria. Musculoskeletal:  Aches all over. No muscle tenderness. Extremities:  Sharp pains in hands (right > left).  No pain or swelling. Skin:  No rashes or skin changes. Neuro:  No headache, numbness or weakness, balance or coordination issues. Endocrine:  Diabetes.  Thyroid disease on Synthroid.  No hot flashes or night sweats. Psych:  No mood changes, depression or anxiety. Pain:  No focal pain. Review of systems:  All other systems reviewed and found to be negative.  Physical Exam: Blood pressure 134/78, pulse 54, temperature 97 F (36.1 C), temperature source Tympanic, weight 198 lb 13.7 oz (90.2 kg). GENERAL:  Well developed, well nourished, sitting comfortably in the exam room in no acute distress.  She has a cane at her side. MENTAL STATUS:  Alert and oriented to person, place and time. HEAD:  Short dark hair with bangs.  Normocephalic, atraumatic, face symmetric, no Cushingoid features. EYES:  Hazel/brown eyes.  Pupils equal round and reactive to light and  accomodation. No conjunctivitis or scleral icterus. ENT: Oropharynx clear without lesion. Tongue normal. Mucous membranes moist.  RESPIRATORY: Clear to auscultation without rales, wheezes or rhonchi. CARDIOVASCULAR: Regular rate and rhythm without murmur, rub or gallop. BREAST: Right mastectomy with well healed incision without erythema or nodularity. Left chest with significant post-op changes and indentation at 2 o'clock position. No discrete masses, skin changes or nipple discharge.  ABDOMEN: Soft, non-tender, with active bowel sounds, and no hepatosplenomegaly. No masses. SKIN: No rashes, ulcers or lesions. EXTREMITIES: Left lower extremity chronic  edema.  No skin discoloration or tenderness. No palpable cords. LYMPH NODES: No palpable cervical, supraclavicular, axillary or inguinal adenopathy  NEUROLOGICAL: Unremarkable. PSYCH: Appropriate.   Appointment on 12/12/2015  Component Date Value Ref Range Status  . WBC 12/12/2015 8.4  3.6 - 11.0 K/uL Final  . RBC 12/12/2015 4.13  3.80 - 5.20 MIL/uL Final  . Hemoglobin 12/12/2015 12.0  12.0 - 16.0 g/dL Final  . HCT 12/12/2015 36.7  35.0 - 47.0 % Final  . MCV 12/12/2015 88.7  80.0 - 100.0 fL Final  . MCH 12/12/2015 28.9  26.0 - 34.0 pg Final  . MCHC 12/12/2015 32.6  32.0 - 36.0 g/dL Final  . RDW 12/12/2015 15.4* 11.5 - 14.5 % Final  . Platelets 12/12/2015 165  150 - 440 K/uL Final  . Neutrophils Relative % 12/12/2015 59   Final  . Neutro Abs 12/12/2015 5.0  1.4 - 6.5 K/uL Final  . Lymphocytes Relative 12/12/2015 21   Final  . Lymphs Abs 12/12/2015 1.7  1.0 - 3.6 K/uL Final  . Monocytes Relative 12/12/2015 9   Final  . Monocytes Absolute 12/12/2015 0.7  0.2 - 0.9 K/uL Final  . Eosinophils Relative 12/12/2015 10   Final  . Eosinophils Absolute 12/12/2015 0.8* 0 - 0.7 K/uL Final  . Basophils Relative 12/12/2015 1   Final  . Basophils Absolute 12/12/2015 0.1  0 - 0.1 K/uL Final  . Sodium 12/12/2015 136  135 - 145 mmol/L Final   . Potassium 12/12/2015 4.5  3.5 - 5.1 mmol/L Final  . Chloride 12/12/2015 104  101 - 111 mmol/L Final  . CO2 12/12/2015 29  22 - 32 mmol/L Final  . Glucose, Bld 12/12/2015 139* 65 - 99 mg/dL Final  . BUN 12/12/2015 23* 6 - 20 mg/dL Final  . Creatinine, Ser 12/12/2015 1.06* 0.44 - 1.00 mg/dL Final  . Calcium 12/12/2015 9.0  8.9 - 10.3 mg/dL Final  . Total Protein 12/12/2015 7.6  6.5 - 8.1 g/dL Final  . Albumin 12/12/2015 3.9  3.5 - 5.0 g/dL Final  . AST 12/12/2015 17  15 - 41 U/L Final  . ALT 12/12/2015 11* 14 - 54 U/L Final  . Alkaline Phosphatase 12/12/2015 57  38 - 126 U/L Final  . Total Bilirubin 12/12/2015 0.6  0.3 - 1.2 mg/dL Final  . GFR calc non Af Amer 12/12/2015 49* >60 mL/min Final  . GFR calc Af Amer 12/12/2015 57* >60 mL/min Final   Comment: (NOTE) The eGFR has been calculated using the CKD EPI equation. This calculation has not been validated in all clinical situations. eGFR's persistently <60 mL/min signify possible Chronic Kidney Disease.   Georgiann Hahn gap 12/12/2015 3* 5 - 15 Final    Assessment:  Crystal Frederick is a 75 y.o. 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  She presented in 04/1998 with a 4 x 2.2 x 2.2 cm right breast cancer.  She underwent lumpectomy.  Tumor was ER, PR, and Her2/neu negative.  She received adjuvant adriamycin and Cytoxan (AC) followed by Taxol.  She received radiation.  She developed recurrent disease in 12/2000.  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  She underwent modified radical mastectomy.  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  She developed left breast cancer in 11/2009.  She underwent mastectomy in 12/2009.  Tumor was ER positive, PR positive, and  Her2/neu negative.  Oncotype score was 33 (high).  She received 6 cycles of Taxotere and Cytoxan (TC) from 01/29/2010 - 05/15/2010.  She  completed left chest wall radiation in 07/2010.  Arimidex started in 07/2010.  She completed 5 years of therapy.  CA 27-29 was 30.9 (normal) on 05/23/2015 and 30.5 on 12/12/2015.  She fell in 10/2013 and sustained an orbital fracture.  CT face revealed a subpleural density in both upper lobes felt secondary to scarring.  She is being followed by Dr. Raul Del of pulmonary medicine.   She had an intermittent headache and a pressure sensation on the right side of her head and balance problem for 4-5 months.  Head MRI on 11/30/2014 revealed no metastatic disease. She had small areas of hemosiderin both anterior frontal lobes and also the right posterior temporal lobe sequela of trauma. There was chronic left sided MCA infarct. Cervical spine MRI on 11/30/2014 revealed subtle signal changes along the ventral C4 and C5-C6 level suspicious for posterior anterior ligamentous injury. There was widespread cervical spine disc and endplate degeneration. There was no evidence of metastatic disease.  Bone density study on 06/04/2015 revealed a osteoporosis with a T score of -3.2 in the forearm, -2.3 in the left femoral neck, and -0.9 in the left hip.  She is on Fosamax weekly.  She has a history of a normocytic anemia.  She has a history of Celebrex-induced gastritis/ulcer.  Colonoscopy on 03/05/2015 was "okay" per patient report. She denies any melena or hematochezia.  She eats meat about 3-4 times a week. She denies any pica.  Work-up on 06/20/2015 revealed the following normal studies: hematocrit (37.2), reticulocyte count, ferritin, B12, folate, and TSH.   Symptomatically, she denies any breast concerns.  She has bilateral hand pain (right > left).  Exam reveals post-operative changes.  Plan: 1.  Labs today:  CBC with diff, CMP, CA27.29 2.  Discuss benefit of adjuvant hormonal therapy for 5 versus 10 years.  She has completed 5 years.  Discuss Breast Cancer Index (BCI) testing.  She is interested in testing. 3.   Continue calcium and vitamin D.  Patient on Fosamax for osteoporosis. 4.  RTC after BCI test results available.   Lequita Asal, MD  12/12/2015, 12:25 PM

## 2015-12-12 NOTE — Progress Notes (Signed)
Patient here for follow up no complaints today. 

## 2015-12-13 LAB — CANCER ANTIGEN 27.29: CA 27.29: 30.5 U/mL (ref 0.0–38.6)

## 2016-01-15 ENCOUNTER — Encounter: Payer: Self-pay | Admitting: Hematology and Oncology

## 2016-02-04 ENCOUNTER — Encounter: Payer: Self-pay | Admitting: Hematology and Oncology

## 2016-02-07 ENCOUNTER — Encounter: Payer: Self-pay | Admitting: Hematology and Oncology

## 2016-09-11 ENCOUNTER — Other Ambulatory Visit
Admission: RE | Admit: 2016-09-11 | Discharge: 2016-09-11 | Disposition: A | Discharge status: Home / Self Care | Payer: Medicare Other | Source: Ambulatory Visit | Attending: Otolaryngology | Admitting: Otolaryngology

## 2016-09-11 DIAGNOSIS — E039 Hypothyroidism, unspecified: Secondary | ICD-10-CM | POA: Insufficient documentation

## 2016-09-11 LAB — TSH: TSH: 3.178 u[IU]/mL (ref 0.350–4.500)

## 2016-09-11 LAB — T4, FREE: Free T4: 0.73 ng/dL (ref 0.61–1.12)

## 2016-09-12 LAB — T3, FREE: T3, Free: 2.5 pg/mL (ref 2.0–4.4)

## 2017-01-27 ENCOUNTER — Other Ambulatory Visit
Admission: RE | Admit: 2017-01-27 | Discharge: 2017-01-27 | Disposition: A | Discharge status: Home / Self Care | Payer: Medicare Other | Source: Ambulatory Visit | Attending: Specialist | Admitting: Specialist

## 2017-01-27 DIAGNOSIS — R0609 Other forms of dyspnea: Secondary | ICD-10-CM | POA: Insufficient documentation

## 2017-01-27 LAB — BRAIN NATRIURETIC PEPTIDE: B Natriuretic Peptide: 207 pg/mL — ABNORMAL HIGH (ref 0.0–100.0)

## 2017-01-28 ENCOUNTER — Other Ambulatory Visit: Payer: Self-pay | Admitting: Specialist

## 2017-01-28 DIAGNOSIS — R0609 Other forms of dyspnea: Principal | ICD-10-CM

## 2017-01-28 DIAGNOSIS — R918 Other nonspecific abnormal finding of lung field: Secondary | ICD-10-CM

## 2017-02-03 ENCOUNTER — Ambulatory Visit
Admission: RE | Admit: 2017-02-03 | Discharge: 2017-02-03 | Disposition: A | Discharge status: Home / Self Care | Payer: Medicare Other | Source: Ambulatory Visit | Attending: Specialist | Admitting: Specialist

## 2017-02-03 DIAGNOSIS — J90 Pleural effusion, not elsewhere classified: Secondary | ICD-10-CM | POA: Diagnosis not present

## 2017-02-03 DIAGNOSIS — R918 Other nonspecific abnormal finding of lung field: Secondary | ICD-10-CM | POA: Insufficient documentation

## 2017-02-03 DIAGNOSIS — R0609 Other forms of dyspnea: Secondary | ICD-10-CM | POA: Insufficient documentation

## 2017-02-03 DIAGNOSIS — I2510 Atherosclerotic heart disease of native coronary artery without angina pectoris: Secondary | ICD-10-CM | POA: Diagnosis not present

## 2017-02-03 DIAGNOSIS — I700 Atherosclerosis of aorta: Secondary | ICD-10-CM | POA: Insufficient documentation

## 2017-02-03 DIAGNOSIS — I517 Cardiomegaly: Secondary | ICD-10-CM | POA: Diagnosis not present

## 2017-03-26 ENCOUNTER — Other Ambulatory Visit: Payer: Self-pay | Admitting: Specialist

## 2017-03-26 DIAGNOSIS — R918 Other nonspecific abnormal finding of lung field: Secondary | ICD-10-CM

## 2017-04-04 ENCOUNTER — Encounter: Payer: Self-pay | Admitting: Emergency Medicine

## 2017-04-04 ENCOUNTER — Observation Stay
Admission: EM | Admit: 2017-04-04 | Discharge: 2017-04-06 | Disposition: A | Discharge status: Home / Self Care | Payer: Medicare Other | Attending: Internal Medicine | Admitting: Internal Medicine

## 2017-04-04 DIAGNOSIS — N179 Acute kidney failure, unspecified: Secondary | ICD-10-CM | POA: Insufficient documentation

## 2017-04-04 DIAGNOSIS — E875 Hyperkalemia: Secondary | ICD-10-CM | POA: Diagnosis present

## 2017-04-04 DIAGNOSIS — Z87891 Personal history of nicotine dependence: Secondary | ICD-10-CM | POA: Insufficient documentation

## 2017-04-04 DIAGNOSIS — E7800 Pure hypercholesterolemia, unspecified: Secondary | ICD-10-CM | POA: Insufficient documentation

## 2017-04-04 DIAGNOSIS — Z6831 Body mass index (BMI) 31.0-31.9, adult: Secondary | ICD-10-CM | POA: Insufficient documentation

## 2017-04-04 DIAGNOSIS — Z7982 Long term (current) use of aspirin: Secondary | ICD-10-CM | POA: Diagnosis not present

## 2017-04-04 DIAGNOSIS — Z853 Personal history of malignant neoplasm of breast: Secondary | ICD-10-CM | POA: Insufficient documentation

## 2017-04-04 DIAGNOSIS — E11649 Type 2 diabetes mellitus with hypoglycemia without coma: Secondary | ICD-10-CM | POA: Diagnosis not present

## 2017-04-04 DIAGNOSIS — E1122 Type 2 diabetes mellitus with diabetic chronic kidney disease: Secondary | ICD-10-CM | POA: Diagnosis not present

## 2017-04-04 DIAGNOSIS — E669 Obesity, unspecified: Secondary | ICD-10-CM | POA: Diagnosis not present

## 2017-04-04 DIAGNOSIS — E559 Vitamin D deficiency, unspecified: Secondary | ICD-10-CM | POA: Insufficient documentation

## 2017-04-04 DIAGNOSIS — E1151 Type 2 diabetes mellitus with diabetic peripheral angiopathy without gangrene: Secondary | ICD-10-CM | POA: Insufficient documentation

## 2017-04-04 DIAGNOSIS — E162 Hypoglycemia, unspecified: Secondary | ICD-10-CM | POA: Diagnosis present

## 2017-04-04 DIAGNOSIS — E1140 Type 2 diabetes mellitus with diabetic neuropathy, unspecified: Secondary | ICD-10-CM | POA: Diagnosis not present

## 2017-04-04 DIAGNOSIS — Z7984 Long term (current) use of oral hypoglycemic drugs: Secondary | ICD-10-CM | POA: Insufficient documentation

## 2017-04-04 DIAGNOSIS — I129 Hypertensive chronic kidney disease with stage 1 through stage 4 chronic kidney disease, or unspecified chronic kidney disease: Secondary | ICD-10-CM | POA: Insufficient documentation

## 2017-04-04 DIAGNOSIS — N183 Chronic kidney disease, stage 3 (moderate): Secondary | ICD-10-CM | POA: Insufficient documentation

## 2017-04-04 DIAGNOSIS — M109 Gout, unspecified: Secondary | ICD-10-CM | POA: Insufficient documentation

## 2017-04-04 DIAGNOSIS — E860 Dehydration: Secondary | ICD-10-CM | POA: Insufficient documentation

## 2017-04-04 DIAGNOSIS — G4730 Sleep apnea, unspecified: Secondary | ICD-10-CM | POA: Insufficient documentation

## 2017-04-04 DIAGNOSIS — Z79899 Other long term (current) drug therapy: Secondary | ICD-10-CM | POA: Diagnosis not present

## 2017-04-04 DIAGNOSIS — R1110 Vomiting, unspecified: Secondary | ICD-10-CM

## 2017-04-04 LAB — COMPREHENSIVE METABOLIC PANEL
ALBUMIN: 3.3 g/dL — AB (ref 3.5–5.0)
ALK PHOS: 55 U/L (ref 38–126)
ALT: 15 U/L (ref 14–54)
ANION GAP: 7 (ref 5–15)
AST: 21 U/L (ref 15–41)
BILIRUBIN TOTAL: 0.4 mg/dL (ref 0.3–1.2)
BUN: 31 mg/dL — ABNORMAL HIGH (ref 6–20)
CALCIUM: 8.9 mg/dL (ref 8.9–10.3)
CO2: 24 mmol/L (ref 22–32)
Chloride: 106 mmol/L (ref 101–111)
Creatinine, Ser: 1.67 mg/dL — ABNORMAL HIGH (ref 0.44–1.00)
GFR calc Af Amer: 33 mL/min — ABNORMAL LOW (ref >60)
GFR calc non Af Amer: 28 mL/min — ABNORMAL LOW (ref >60)
GLUCOSE: 140 mg/dL — AB (ref 65–99)
Potassium: 6 mmol/L — ABNORMAL HIGH (ref 3.5–5.1)
Sodium: 137 mmol/L (ref 135–145)
TOTAL PROTEIN: 6.7 g/dL (ref 6.5–8.1)

## 2017-04-04 LAB — GLUCOSE, CAPILLARY
GLUCOSE-CAPILLARY: 135 mg/dL — AB (ref 65–99)
GLUCOSE-CAPILLARY: 97 mg/dL (ref 65–99)
GLUCOSE-CAPILLARY: 101 mg/dL — AB (ref 65–99)
GLUCOSE-CAPILLARY: 79 mg/dL (ref 65–99)
GLUCOSE-CAPILLARY: 106 mg/dL — AB (ref 65–99)
Glucose-Capillary: 85 mg/dL (ref 65–99)

## 2017-04-04 LAB — URINALYSIS, COMPLETE (UACMP) WITH MICROSCOPIC
Bacteria, UA: NONE SEEN
Bilirubin Urine: NEGATIVE
GLUCOSE, UA: NEGATIVE mg/dL
Hgb urine dipstick: NEGATIVE
KETONES UR: NEGATIVE mg/dL
LEUKOCYTES UA: NEGATIVE
Nitrite: NEGATIVE
PH: 7 (ref 5.0–8.0)
PROTEIN: NEGATIVE mg/dL
RBC / HPF: NONE SEEN RBC/hpf (ref 0–5)
SPECIFIC GRAVITY, URINE: 1.005 (ref 1.005–1.030)

## 2017-04-04 LAB — CBC
HEMATOCRIT: 32.6 % — AB (ref 35.0–47.0)
HEMOGLOBIN: 10.9 g/dL — AB (ref 12.0–16.0)
MCH: 29.2 pg (ref 26.0–34.0)
MCHC: 33.4 g/dL (ref 32.0–36.0)
MCV: 87.5 fL (ref 80.0–100.0)
Platelets: 151 10*3/uL (ref 150–440)
RBC: 3.72 MIL/uL — ABNORMAL LOW (ref 3.80–5.20)
RDW: 15.4 % — ABNORMAL HIGH (ref 11.5–14.5)
WBC: 9.2 10*3/uL (ref 3.6–11.0)

## 2017-04-04 LAB — POTASSIUM: POTASSIUM: 5.9 mmol/L — AB (ref 3.5–5.1)

## 2017-04-04 MED ORDER — ACETAMINOPHEN 325 MG PO TABS
650.0000 mg | ORAL_TABLET | Freq: Once | ORAL | Status: AC
  Administered 2017-04-04: 650 mg via ORAL
  Filled 2017-04-04: qty 2

## 2017-04-04 MED ORDER — SODIUM CHLORIDE 0.9 % IV BOLUS (SEPSIS)
1000.0000 mL | Freq: Once | INTRAVENOUS | Status: DC
  Administered 2017-04-04: 500 mL via INTRAVENOUS

## 2017-04-04 MED ORDER — FLUTICASONE PROPIONATE 50 MCG/ACT NA SUSP
2.0000 | Freq: Every day | NASAL | Status: DC
  Administered 2017-04-04 – 2017-04-06 (×3): 2 via NASAL
  Filled 2017-04-04: qty 16

## 2017-04-04 MED ORDER — ALBUTEROL SULFATE (2.5 MG/3ML) 0.083% IN NEBU: 2.5000 mg | INHALATION_SOLUTION | Freq: Four times a day (QID) | RESPIRATORY_TRACT | Status: DC | PRN

## 2017-04-04 MED ORDER — ALBUTEROL SULFATE HFA 108 (90 BASE) MCG/ACT IN AERS: 2.0000 | INHALATION_SPRAY | Freq: Four times a day (QID) | RESPIRATORY_TRACT | Status: DC | PRN

## 2017-04-04 MED ORDER — INSULIN ASPART 100 UNIT/ML ~~LOC~~ SOLN: 0.0000 [IU] | Freq: Every day | SUBCUTANEOUS | Status: DC

## 2017-04-04 MED ORDER — PANTOPRAZOLE SODIUM 40 MG PO TBEC
40.0000 mg | DELAYED_RELEASE_TABLET | Freq: Every day | ORAL | Status: DC
  Administered 2017-04-04 – 2017-04-06 (×3): 40 mg via ORAL
  Filled 2017-04-04 (×3): qty 1

## 2017-04-04 MED ORDER — ACETAMINOPHEN 500 MG PO TABS
1000.0000 mg | ORAL_TABLET | Freq: Every day | ORAL | Status: DC
  Administered 2017-04-04 – 2017-04-06 (×3): 1000 mg via ORAL
  Filled 2017-04-04 (×3): qty 2

## 2017-04-04 MED ORDER — HEPARIN SODIUM (PORCINE) 5000 UNIT/ML IJ SOLN
5000.0000 [IU] | Freq: Three times a day (TID) | INTRAMUSCULAR | Status: DC
  Administered 2017-04-04 – 2017-04-06 (×7): 5000 [IU] via SUBCUTANEOUS
  Filled 2017-04-04 (×7): qty 1

## 2017-04-04 MED ORDER — ASPIRIN EC 81 MG PO TBEC
81.0000 mg | DELAYED_RELEASE_TABLET | Freq: Every day | ORAL | Status: DC
  Administered 2017-04-04 – 2017-04-06 (×3): 81 mg via ORAL
  Filled 2017-04-04 (×3): qty 1

## 2017-04-04 MED ORDER — LEVOTHYROXINE SODIUM 50 MCG PO TABS
50.0000 ug | ORAL_TABLET | Freq: Every day | ORAL | Status: DC
  Administered 2017-04-05 – 2017-04-06 (×2): 50 ug via ORAL
  Filled 2017-04-04 (×2): qty 1

## 2017-04-04 MED ORDER — BACLOFEN 10 MG PO TABS
10.0000 mg | ORAL_TABLET | Freq: Two times a day (BID) | ORAL | Status: DC
  Administered 2017-04-04 – 2017-04-05 (×4): 10 mg via ORAL
  Filled 2017-04-04 (×5): qty 1

## 2017-04-04 MED ORDER — TRAZODONE HCL 50 MG PO TABS
25.0000 mg | ORAL_TABLET | Freq: Every day | ORAL | Status: DC
  Administered 2017-04-04 – 2017-04-05 (×2): 25 mg via ORAL
  Filled 2017-04-04 (×2): qty 1

## 2017-04-04 MED ORDER — SODIUM CHLORIDE 0.9 % IV SOLN
INTRAVENOUS | Status: DC
  Administered 2017-04-04 – 2017-04-06 (×4): via INTRAVENOUS

## 2017-04-04 MED ORDER — ACETAMINOPHEN 650 MG RE SUPP: 650.0000 mg | Freq: Four times a day (QID) | RECTAL | Status: DC | PRN

## 2017-04-04 MED ORDER — CALCIUM CARBONATE 600 MG PO TABS: 600.0000 mg | ORAL_TABLET | Freq: Two times a day (BID) | ORAL | Status: DC

## 2017-04-04 MED ORDER — CARVEDILOL 3.125 MG PO TABS
12.5000 mg | ORAL_TABLET | Freq: Two times a day (BID) | ORAL | Status: DC
  Administered 2017-04-04: 12.5 mg via ORAL
  Filled 2017-04-04: qty 4

## 2017-04-04 MED ORDER — CALCIUM CARBONATE ANTACID 500 MG PO CHEW
3.0000 | CHEWABLE_TABLET | Freq: Two times a day (BID) | ORAL | Status: DC
  Administered 2017-04-04 – 2017-04-06 (×4): 600 mg via ORAL
  Filled 2017-04-04 (×4): qty 3

## 2017-04-04 MED ORDER — GABAPENTIN 300 MG PO CAPS
600.0000 mg | ORAL_CAPSULE | Freq: Three times a day (TID) | ORAL | Status: DC
  Administered 2017-04-04 – 2017-04-06 (×6): 600 mg via ORAL
  Filled 2017-04-04 (×6): qty 2

## 2017-04-04 MED ORDER — VITAMIN C 500 MG PO TABS
500.0000 mg | ORAL_TABLET | Freq: Every day | ORAL | Status: DC
  Administered 2017-04-04 – 2017-04-06 (×3): 500 mg via ORAL
  Filled 2017-04-04 (×3): qty 1

## 2017-04-04 MED ORDER — ADULT MULTIVITAMIN W/MINERALS CH
ORAL_TABLET | Freq: Every day | ORAL | Status: DC
  Administered 2017-04-04 – 2017-04-06 (×3): 1 via ORAL
  Filled 2017-04-04 (×3): qty 1

## 2017-04-04 MED ORDER — INSULIN ASPART 100 UNIT/ML ~~LOC~~ SOLN
0.0000 [IU] | Freq: Three times a day (TID) | SUBCUTANEOUS | Status: DC
Start: 2017-04-04 — End: 2017-04-06
  Administered 2017-04-05 (×2): 2 [IU] via SUBCUTANEOUS
  Filled 2017-04-04 (×2): qty 1

## 2017-04-04 MED ORDER — ACETAMINOPHEN 325 MG PO TABS: 650.0000 mg | ORAL_TABLET | Freq: Four times a day (QID) | ORAL | Status: DC | PRN

## 2017-04-04 MED ORDER — ANASTROZOLE 1 MG PO TABS
1.0000 mg | ORAL_TABLET | Freq: Every day | ORAL | Status: DC
  Administered 2017-04-04 – 2017-04-06 (×3): 1 mg via ORAL
  Filled 2017-04-04 (×3): qty 1

## 2017-04-04 MED ORDER — ATORVASTATIN CALCIUM 20 MG PO TABS
20.0000 mg | ORAL_TABLET | Freq: Every day | ORAL | Status: DC
  Administered 2017-04-04 – 2017-04-06 (×3): 20 mg via ORAL
  Filled 2017-04-04 (×3): qty 1

## 2017-04-04 MED ORDER — LOSARTAN POTASSIUM 50 MG PO TABS: 100.0000 mg | ORAL_TABLET | Freq: Every day | ORAL | Status: DC

## 2017-04-04 MED ORDER — SODIUM CHLORIDE 0.9 % IV BOLUS (SEPSIS): 500.0000 mL | Freq: Once | INTRAVENOUS | Status: DC

## 2017-04-04 NOTE — Progress Notes (Signed)
   04/04/17 1700  Clinical Encounter Type  Visited With Patient;Patient and family together  Visit Type Initial;Spiritual support  Referral From Nurse  Spiritual Encounters  Spiritual Needs Literature  HCPOA/AD materials dropped off with RN; Patient asleep.  Hall available as needed

## 2017-04-04 NOTE — ED Provider Notes (Signed)
[facility] Emergency Department Provider Note  ____________________________________________   I have reviewed the triage vital signs and the nursing notes.   HISTORY  Chief Complaint Altered Mental Status    HPI Crystal Frederick is a 76 y.o. female   Has no complaints at this time, according to EMS though her sugar was very low this morning, 50, they gave her an amp of D50 and she felt fine. Patient is a limited historian, no family are present and I tried to call the power of attorney/emergency contact and no one answered. I did leave a message. Therefore no further history is available from patient.Level 5 chart caveat; no further history available due to patient status. Patient states that she thinks she's been eating and drinking well recently. She has no acute pains anywhere she denies any chest patient's breath or vomiting. Apparently, and rehabilitation according to EMS there were regularly checking her sugars but it is not being regularly checked now that she is at home.    Past Medical History:  Diagnosis Date  . Arthritis   . Cancer ([place])   . Diabetes mellitus without complication ([place])   . DVT (deep venous thrombosis) ([place])   . Gout   . Hypertension   . Multinodular goiter   . Obesity   . Peripheral neuropathy   . Peripheral neuropathy   . Peripheral vascular disease ([place])   . Pure hypercholesterolemia   . Sleep apnea   . Thrombocytopenia ([place])   . Vitamin D deficiency     Patient Active Problem List   Diagnosis Date Noted  . Osteoporosis 06/04/2015  . Bilateral breast cancer ([place]) 05/23/2015  . Headache, tension-type 02/23/2015  . Lacunar infarction ([place]) 12/21/2014  . Hay fever 11/24/2014  . Other long term (current) drug therapy 11/24/2014  . Avitaminosis D 05/20/2014  . Arthritis of knee, degenerative 05/20/2014  . 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 Type 2 diabetes mellitus ([place]) 04/13/2014  . Gout  04/13/2014  . 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 Obstructive apnea 12/29/2013  . Adiposity 12/29/2013  . Absolute anemia 11/26/2013  . Benign essential HTN 11/26/2013  . History of artificial joint 11/26/2013  . Pure hypercholesterolemia 11/26/2013  . Disease of nail 11/26/2013    Past Surgical History:  Procedure Laterality Date  . BREAST SURGERY    . COLONOSCOPY WITH PROPOFOL N/A 03/05/2015   Procedure: COLONOSCOPY WITH PROPOFOL;  Surgeon: Manya Silvas, MD;  Location: [facility] ENDOSCOPY;  Service: Endoscopy;  Laterality: N/A;  . JOINT REPLACEMENT    . MASTECTOMY    . WISDOM TOOTH EXTRACTION      Prior to Admission medications   Medication Sig Start Date End Date Taking? Authorizing Provider  alendronate (FOSAMAX) 70 MG tablet Take 70 mg by mouth once a week. Take with a full glass of water on an empty stomach.   Yes [provider]  atorvastatin (LIPITOR) 20 MG tablet Take 20 mg by mouth daily.   Yes [provider]  baclofen (LIORESAL) 10 MG tablet Take 10 mg by mouth 2 (two) times daily.    Yes [provider]  carvedilol (COREG) 12.5 MG tablet Take 12.5 mg by mouth 2 (two) times daily with a meal.   Yes [provider]  fluticasone (FLONASE) 50 MCG/ACT nasal spray Place 2 sprays into both nostrils daily.   Yes [provider]  furosemide (LASIX) 40 MG tablet Take 40 mg by mouth daily.   Yes [provider]  gabapentin (NEURONTIN) 300 MG capsule Take 600 mg by mouth 3 (three) times daily.    Yes [provider]  glimepiride (AMARYL) 2 MG tablet Take 2 mg by mouth daily with breakfast.    Yes [provider]  levothyroxine (SYNTHROID, LEVOTHROID) 50 MCG tablet Take 50 mcg by mouth daily before breakfast.   Yes [provider]  losartan (COZAAR) 100 MG tablet Take 100 mg by mouth daily.   Yes [provider]  metFORMIN (GLUCOPHAGE-XR) 500 MG 24 hr tablet Take 1,000 mg by mouth daily with  breakfast.    Yes [provider]  omeprazole (PRILOSEC) 20 MG capsule Take 20 mg by mouth daily.   Yes [provider]  traZODone (DESYREL) 50 MG tablet Take 25 mg by mouth at bedtime.   Yes [provider]  acetaminophen (TYLENOL) 500 MG tablet Take 1,000 mg by mouth daily.    [provider]  albuterol (PROVENTIL HFA;VENTOLIN HFA) 108 (90 BASE) MCG/ACT inhaler Inhale 2 puffs into the lungs every 6 (six) hours as needed for wheezing or shortness of breath.    [provider]  anastrozole (ARIMIDEX) 1 MG tablet Take 1 tablet (1 mg total) by mouth daily. Patient not taking: Reported on 04/04/2017 12/12/15   Lequita Asal, MD  aspirin EC 81 MG tablet Take 81 mg by mouth daily.    [provider]  calcium carbonate (OS-CAL) 600 MG TABS tablet Take 600 mg by mouth 2 (two) times daily with a meal.    [provider]  glucose blood test strip 1 each by Other route as needed for other. Use as instructed    [provider]  MULTIPLE VITAMIN-FOLIC ACID PO Take 1 tablet by mouth daily.    [provider]  vitamin C (ASCORBIC ACID) 500 MG tablet Take 500 mg by mouth daily.    [provider]    Allergies Celebrex [celecoxib]; Metformin and related; Tramadol; and Voltaren [diclofenac sodium]  Family History  Problem Relation Age of Onset  . Hypertension Mother   . Cancer Mother        Breast  . Cancer Father        Leukemia  . Cancer Maternal Aunt        UNsure of type    Social History Social History  Substance Use Topics  . Smoking status: Former Research scientist (life sciences)  . Smokeless tobacco: Never Used  . Alcohol use No    Review of Systems Constitutional: No fever/chills Eyes: No visual changes. ENT: No sore throat. No stiff neck no neck pain Cardiovascular: Denies chest pain. Respiratory: Denies shortness of breath. Gastrointestinal:   no vomiting.  No diarrhea.  No constipation. Genitourinary: Negative  for dysuria. Musculoskeletal: Negative lower extremity swelling Skin: Negative for rash. Neurological: Negative for severe headaches, focal weakness or numbness.   ____________________________________________   PHYSICAL EXAM:  VITAL SIGNS: ED Triage Vitals  Enc Vitals Group     BP 04/04/17 0916 118/77     Pulse Rate 04/04/17 0916 77     Resp 04/04/17 0916 18     Temp 04/04/17 0916 (!) 97.4 F (36.3 C)     Temp Source 04/04/17 0916 Oral     SpO2 04/04/17 0916 98 %     Weight 04/04/17 0918 175 lb 7.8 oz (79.6 kg)     Height --      Head Circumference --      Peak Flow --  Pain Score --      Pain Loc --      Pain Edu? --      Excl. in [place]? --     Constitutional: Alert and orientedTo name and place, not quite sure of the date. Well appearing and in no acute distress. Eyes: Conjunctivae are normal Head: Atraumatic HEENT: No congestion/rhinnorhea. Mucous membranes are slightly dry.  Oropharynx non-erythematous Neck:   Nontender with no meningismus, no masses, no stridor Cardiovascular: Normal rate, regular rhythm. Grossly normal heart sounds.  Good peripheral circulation. Respiratory: Normal respiratory effort.  No retractions. Lungs CTAB. Abdominal: Soft and nontender. No distention. No guarding no rebound Back:  There is no focal tenderness or step off.  there is no midline tenderness there are no lesions noted. there is no CVA tenderness Musculoskeletal: No lower extremity tenderness, no upper extremity tenderness. No joint effusions, no DVT signs strong distal pulses no edema Neurologic:  Normal speech and language. No gross focal neurologic deficits are appreciated.  Skin:  Skin is warm, dry and intact. No rash noted. Psychiatric: Mood and affect are normal. Speech and behavior are normal.  ____________________________________________   LABS (all labs ordered are listed, but only abnormal results are displayed)  Labs Reviewed  CBC - Abnormal; Notable for the  following:       Result Value   RBC 3.72 (*)    Hemoglobin 10.9 (*)    HCT 32.6 (*)    RDW 15.4 (*)    All other components within normal limits  COMPREHENSIVE METABOLIC PANEL - Abnormal; Notable for the following:    Potassium 6.0 (*)    Glucose, Bld 140 (*)    BUN 31 (*)    Creatinine, Ser 1.67 (*)    Albumin 3.3 (*)    GFR calc non Af Amer 28 (*)    GFR calc Af Amer 33 (*)    All other components within normal limits  GLUCOSE, CAPILLARY - Abnormal; Notable for the following:    Glucose-Capillary 135 (*)    All other components within normal limits  URINALYSIS, COMPLETE (UACMP) WITH MICROSCOPIC  CBG MONITORING, ED  CBG MONITORING, ED   ____________________________________________  EKG  I personally interpreted any EKGs ordered by me or triage Normal sinus rhythm at 76 bpm no acute ST elevation or depression normal axis unremarkable EKG ____________________________________________  RADIOLOGY  I reviewed any imaging ordered by me or triage that were performed during my shift and, if possible, patient and/or family made aware of any abnormal findings. ____________________________________________   PROCEDURES  Procedure(s) performed: None  Procedures  Critical Care performed: None  ____________________________________________   INITIAL IMPRESSION / ASSESSMENT AND PLAN / ED COURSE  Pertinent labs & imaging results that were available during my care of the patient were reviewed by me and considered in my medical decision making (see chart for details).  She knew of hypoglycemia in the context of insulin administration however basic blood work does show some degree of dehydration with a elevated K. We will give her IV fluids for this. She is on Lasix and likely somewhat potassium. No EKG changes with this. Given her history of heart failure we will start with 500 cc bolus. As there is no evidence of other pathology attendant upon this incidentally noted hyperkalemia,  we will defer more aggressive therapy which carries its own risks. Dr. Edwina Barth of the hospitalist service agrees w/ this mgt and will admit.     ____________________________________________   FINAL CLINICAL IMPRESSION(S) /  ED DIAGNOSES  Final diagnoses:  None      This chart was dictated using voice recognition software.  Despite best efforts to proofread,  errors can occur which can change meaning.      Schuyler Amor, MD 04/04/17 1055

## 2017-04-04 NOTE — Plan of Care (Signed)
Problem: Education: Goal: Knowledge of [facility] Education information/materials will improve Outcome: Progressing Pt admitted from the ED. Alert to self and place. Follow commands. Pt at her baseline per daughter.  Problem: Pain Managment: Goal: General experience of comfort will improve Outcome: Progressing Chronic generalized pain managed with tylenol.  Problem: Physical Regulation: Goal: Ability to maintain clinical measurements within normal limits will improve Outcome: Progressing Potassium level 5.9. NSR on cardiac monitor. CBG stable.

## 2017-04-04 NOTE — ED Triage Notes (Signed)
Patent from home, arrives via [facility]. EMS reported AMS with CBG of 50 on scene. EMS administered 58ml of D10 with resultant CBG of 243. Patient was ambulatory to EMS truck post administration and now states that she feels much better. Patient is back to baseline per daughter's report.

## 2017-04-04 NOTE — ED Notes (Signed)
Pt assisted to the bathroom by this RN. Clean sheets and gown provided by this RN due to patient having episode of urine incontinence. Pt back to bed without incident.

## 2017-04-04 NOTE — H&P (Signed)
Crystal Frederick is an 76 y.o. female.   Chief Complaint: less responsive HPI: this is a 76 year old female. She has a history of diabetes and chronic diabetic neuropathy. Her daughter who is her caretaker says she's not been eating and drinking as well last few days. Yesterday her physical therapist noted her blood pressure was a little low. Today she is found to be less responsive, clammy location and EMS was called and she was found to have a blood sugar of 50. She was given D50 in the ER with good response but sugars continue to drop after it stopped. She is also found to have a potassium of 6.  Past Medical History:  Diagnosis Date  . Arthritis   . Cancer ([employer])   . Diabetes mellitus without complication ([place])   . DVT (deep venous thrombosis) ([place])   . Gout   . Hypertension   . Multinodular goiter   . Obesity   . Peripheral neuropathy   . Peripheral neuropathy   . Peripheral vascular disease ([place])   . Pure hypercholesterolemia   . Sleep apnea   . Thrombocytopenia ([place])   . Vitamin D deficiency     Past Surgical History:  Procedure Laterality Date  . BREAST SURGERY    . COLONOSCOPY WITH PROPOFOL N/A 03/05/2015   Procedure: COLONOSCOPY WITH PROPOFOL;  Surgeon: Manya Silvas, MD;  Location: [facility] ENDOSCOPY;  Service: Endoscopy;  Laterality: N/A;  . JOINT REPLACEMENT    . MASTECTOMY    . WISDOM TOOTH EXTRACTION      Family History  Problem Relation Age of Onset  . Hypertension Mother   . Cancer Mother        Breast  . Cancer Father        Leukemia  . Cancer Maternal Aunt        UNsure of type   Social History:  reports that she has quit smoking. She has never used smokeless tobacco. She reports that she does not drink alcohol or use drugs.  Allergies:  Allergies  Allergen Reactions  . Celebrex [Celecoxib] Other (See Comments)  . Metformin And Related Diarrhea  . Tramadol Nausea And Vomiting  . Voltaren [Diclofenac Sodium] Nausea And Vomiting     (Not in a  hospital admission)  Results for orders placed or performed during the hospital encounter of 04/04/17 (from the past 48 hour(s))  CBC     Status: Abnormal   Collection Time: 04/04/17  9:20 AM  Result Value Ref Range   WBC 9.2 3.6 - 11.0 K/uL   RBC 3.72 (L) 3.80 - 5.20 MIL/uL   Hemoglobin 10.9 (L) 12.0 - 16.0 g/dL   HCT 32.6 (L) 35.0 - 47.0 %   MCV 87.5 80.0 - 100.0 fL   MCH 29.2 26.0 - 34.0 pg   MCHC 33.4 32.0 - 36.0 g/dL   RDW 15.4 (H) 11.5 - 14.5 %   Platelets 151 150 - 440 K/uL  Comprehensive metabolic panel     Status: Abnormal   Collection Time: 04/04/17  9:20 AM  Result Value Ref Range   Sodium 137 135 - 145 mmol/L   Potassium 6.0 (H) 3.5 - 5.1 mmol/L   Chloride 106 101 - 111 mmol/L   CO2 24 22 - 32 mmol/L   Glucose, Bld 140 (H) 65 - 99 mg/dL   BUN 31 (H) 6 - 20 mg/dL   Creatinine, Ser 1.67 (H) 0.44 - 1.00 mg/dL   Calcium 8.9 8.9 - 10.3 mg/dL  Total Protein 6.7 6.5 - 8.1 g/dL   Albumin 3.3 (L) 3.5 - 5.0 g/dL   AST 21 15 - 41 U/L   ALT 15 14 - 54 U/L   Alkaline Phosphatase 55 38 - 126 U/L   Total Bilirubin 0.4 0.3 - 1.2 mg/dL   GFR calc non Af Amer 28 (L) >60 mL/min   GFR calc Af Amer 33 (L) >60 mL/min    Comment: (NOTE) The eGFR has been calculated using the CKD EPI equation. This calculation has not been validated in all clinical situations. eGFR's persistently <60 mL/min signify possible Chronic Kidney Disease.    Anion gap 7 5 - 15  Glucose, capillary     Status: Abnormal   Collection Time: 04/04/17  9:24 AM  Result Value Ref Range   Glucose-Capillary 135 (H) 65 - 99 mg/dL  Glucose, capillary     Status: None   Collection Time: 04/04/17 10:50 AM  Result Value Ref Range   Glucose-Capillary 85 65 - 99 mg/dL   No results found.  Review of Systems  Constitutional: Negative for chills and fever.  HENT: Negative for hearing loss.   Eyes: Negative for blurred vision.  Respiratory: Negative for cough.   Cardiovascular: Negative for chest pain.   Gastrointestinal: Negative for nausea and vomiting.  Genitourinary: Negative for dysuria.  Musculoskeletal: Positive for myalgias.  Skin: Negative for rash.  Neurological: Negative for dizziness.    Blood pressure 118/77, pulse 77, temperature (!) 97.4 F (36.3 C), temperature source Oral, resp. rate 18, weight 79.6 kg (175 lb 7.8 oz), SpO2 98 %. Physical Exam  Constitutional: She is oriented to person, place, and time. She appears well-developed and well-nourished. No distress.  HENT:  Head: Normocephalic and atraumatic.  Oral mucosa dry  Eyes: Pupils are equal, round, and reactive to light. No scleral icterus.  Neck: Neck supple. No JVD present. No tracheal deviation present. No thyromegaly present.  Cardiovascular: Normal rate and regular rhythm.   Murmur heard. Respiratory: Breath sounds normal. No respiratory distress. She exhibits no tenderness.  GI: Soft. Bowel sounds are normal. She exhibits no distension and no mass. There is no tenderness.  Musculoskeletal: She exhibits tenderness. She exhibits no edema.  Lymphadenopathy:    She has no cervical adenopathy.  Neurological: She is alert and oriented to person, place, and time.  Skin: Skin is warm.     Assessment/Plan 1. Hypoglycemia. Likely secondary to poor by mouth intake with a combination of continuing to take diabetic medicine. Has responded D50. We'll continue to feed her and hold diabetic medications. For now will just put her on sliding scale.. 2. Acute renal failure. Likely from poor by mouth intake. She also takes Lasix. We'll hold the Lasix and give her IV fluids. Repeat her renal function tomorrow. 3. Hyper kalemia. Likely from the renal failure. She is on ACE inhibitor but she is on no potassium supplement that I can find. She's been given some IV fluids and has no EKG changes. I'll recheck her potassium. If not going down then will give Kayexalate. We'll put her on symmetry monitoring for now. 4. Diabetic  neuropathy. She has follow-up appointment with her primary care next week. This is a chronic problem. We'll continue her Neurontin. He will not want to start narcotics on her at this point.  Total time spent 45 minutes  Baxter Hire, MD 04/04/2017, 12:08 PM

## 2017-04-05 ENCOUNTER — Observation Stay: Payer: Medicare Other

## 2017-04-05 ENCOUNTER — Encounter: Payer: Self-pay | Admitting: Internal Medicine

## 2017-04-05 DIAGNOSIS — E11649 Type 2 diabetes mellitus with hypoglycemia without coma: Secondary | ICD-10-CM | POA: Diagnosis not present

## 2017-04-05 LAB — TSH: TSH: 0.556 u[IU]/mL (ref 0.350–4.500)

## 2017-04-05 LAB — BASIC METABOLIC PANEL
Anion gap: 4 — ABNORMAL LOW (ref 5–15)
BUN: 27 mg/dL — AB (ref 6–20)
CHLORIDE: 109 mmol/L (ref 101–111)
CO2: 24 mmol/L (ref 22–32)
Calcium: 8.6 mg/dL — ABNORMAL LOW (ref 8.9–10.3)
Creatinine, Ser: 1.46 mg/dL — ABNORMAL HIGH (ref 0.44–1.00)
GFR calc Af Amer: 39 mL/min — ABNORMAL LOW (ref >60)
GFR, EST NON AFRICAN AMERICAN: 33 mL/min — AB (ref >60)
GLUCOSE: 81 mg/dL (ref 65–99)
POTASSIUM: 5.7 mmol/L — AB (ref 3.5–5.1)
Sodium: 137 mmol/L (ref 135–145)

## 2017-04-05 LAB — GLUCOSE, CAPILLARY
GLUCOSE-CAPILLARY: 83 mg/dL (ref 65–99)
GLUCOSE-CAPILLARY: 116 mg/dL — AB (ref 65–99)
Glucose-Capillary: 80 mg/dL (ref 65–99)
Glucose-Capillary: 121 mg/dL — ABNORMAL HIGH (ref 65–99)
Glucose-Capillary: 127 mg/dL — ABNORMAL HIGH (ref 65–99)

## 2017-04-05 LAB — VITAMIN B12: Vitamin B-12: 252 pg/mL (ref 180–914)

## 2017-04-05 MED ORDER — LIVING WELL WITH DIABETES BOOK
Freq: Once | Status: AC
Start: 2017-04-05 — End: 2017-04-05
  Administered 2017-04-05: 17:00:00
  Filled 2017-04-05: qty 1

## 2017-04-05 MED ORDER — ONDANSETRON HCL 4 MG/2ML IJ SOLN: 4.0000 mg | Freq: Four times a day (QID) | INTRAMUSCULAR | Status: DC | PRN

## 2017-04-05 MED ORDER — SODIUM POLYSTYRENE SULFONATE 15 GM/60ML PO SUSP
30.0000 g | Freq: Once | ORAL | Status: AC
  Administered 2017-04-05: 09:00:00 30 g via ORAL
  Filled 2017-04-05: qty 120

## 2017-04-05 MED ORDER — CARVEDILOL 3.125 MG PO TABS
12.5000 mg | ORAL_TABLET | Freq: Two times a day (BID) | ORAL | Status: DC
  Administered 2017-04-06: 12.5 mg via ORAL
  Filled 2017-04-05: qty 4

## 2017-04-05 NOTE — Plan of Care (Signed)
Problem: Nutrition: Goal: Adequate nutrition will be maintained Outcome: Progressing Pt vomitted x2 in am. Zofran not given pt reported relef. Pt ate well lunch and dinner. No BM yet.

## 2017-04-05 NOTE — Progress Notes (Signed)
[place] [facility] [place] NAME: Crystal Frederick    MR#:  [id number]  DATE OF BIRTH:  Aug 17, 1941  SUBJECTIVE:  CHIEF COMPLAINT:   Chief Complaint  Patient presents with  . Altered Mental Status   More awake and alert.  REVIEW OF SYSTEMS:    Review of Systems  Unable to perform ROS: Mental status change    DRUG ALLERGIES:   Allergies  Allergen Reactions  . Celebrex [Celecoxib] Other (See Comments)  . Metformin And Related Diarrhea  . Tramadol Nausea And Vomiting  . Voltaren [Diclofenac Sodium] Nausea And Vomiting    VITALS:  Blood pressure (!) 112/94, pulse 68, temperature 98.8 F (37.1 C), temperature source Oral, resp. rate 18, height 5\' 2"  (1.575 m), weight 77.8 kg (171 lb 8 oz), SpO2 98 %.  PHYSICAL EXAMINATION:   Physical Exam  GENERAL:  76 y.o.-year-old patient lying in the bed with no acute distress.  EYES: Pupils equal, round, reactive to light and accommodation. No scleral icterus. Extraocular muscles intact.  HEENT: Head atraumatic, normocephalic. Oropharynx and nasopharynx clear.  NECK:  Supple, no jugular venous distention. No thyroid enlargement, no tenderness.  LUNGS: Normal breath sounds bilaterally, no wheezing, rales, rhonchi. No use of accessory muscles of respiration.  CARDIOVASCULAR: S1, S2 normal. No murmurs, rubs, or gallops.  ABDOMEN: Soft, nontender, nondistended. Bowel sounds present. No organomegaly or mass.  EXTREMITIES: No cyanosis, clubbing or edema b/l.    NEUROLOGIC: Cranial nerves II through XII are intact. No focal Motor or sensory deficits b/l.   PSYCHIATRIC: The patient is alert and awake SKIN: No obvious rash, lesion, or ulcer.   LABORATORY PANEL:   CBC  Recent Labs Lab 04/04/17 0920  WBC 9.2  HGB 10.9*  HCT 32.6*  PLT 151   ------------------------------------------------------------------------------------------------------------------ Chemistries   Recent Labs Lab 04/04/17 0920   04/05/17 0357  NA 137  --  137  K 6.0*  < > 5.7*  CL 106  --  109  CO2 24  --  24  GLUCOSE 140*  --  81  BUN 31*  --  27*  CREATININE 1.67*  --  1.46*  CALCIUM 8.9  --  8.6*  AST 21  --   --   ALT 15  --   --   ALKPHOS 55  --   --   BILITOT 0.4  --   --   < > = values in this interval not displayed. ------------------------------------------------------------------------------------------------------------------  Cardiac Enzymes No results for input(s): TROPONINI in the last 168 hours. ------------------------------------------------------------------------------------------------------------------  RADIOLOGY:  No results found.   ASSESSMENT AND PLAN:   * Hypoglycemia Due to poor oral intake and being on glipizide. Oral medications held. Blood sugars in the normal range at this time. Check hemoglobin A1c. Last hemoglobin A1c in May was 6.8. Sliding scale insulin  * Acute kidney injury with hyperkalemia over CKD3. Due to poor oral intake and being on Lasix. Stop Lasix and losartan. Continue IV fluids. Monitor input and output. We'll give 1 dose of Kayexalate. Telemetry monitoring.  * Hypertension. Patient had hypotension on admission due to dehydration. We will discontinue Lasix and losartan. Can restart Coreg from tomorrow.  * Diabetic neuropathy. Continue Neurontin  * Acute encephalopathy due to hypoglycemia is improving. TSH normal.  All the records are reviewed and case discussed with Care Management/Social Worker Management plans discussed with the patient, family and they are in agreement.  CODE STATUS: FULL CODE  DVT Prophylaxis: SCDs  TOTAL TIME TAKING CARE OF THIS PATIENT: 40 minutes.   POSSIBLE D/C IN 1-2 DAYS, DEPENDING ON CLINICAL CONDITION.  Hillary Bow R M.D on 04/05/2017 at 10:26 AM  Between 7am to 6pm - Pager - [phone]  After 6pm go to [email or web address] - password [email or web address] [facility]  Office   [phone]  CC: Primary care physician; Ezequiel Kayser, MD  Note: This dictation was prepared with Dragon dictation along with smaller phrase technology. Any transcriptional errors that result from this process are unintentional.

## 2017-04-05 NOTE — Plan of Care (Signed)
Problem: Safety: Goal: Ability to remain free from injury will improve Outcome: Progressing Pt has on yellow armband and socks. Pt is high fall risk. Bed alarm is activated.

## 2017-04-05 NOTE — Progress Notes (Signed)
Notified Dr Darvin Neighbours that pt vomittedx2 today, last BM 04/02/18, lower abdomen tender. MD to place orders.

## 2017-04-06 DIAGNOSIS — E11649 Type 2 diabetes mellitus with hypoglycemia without coma: Secondary | ICD-10-CM | POA: Diagnosis not present

## 2017-04-06 LAB — BASIC METABOLIC PANEL
ANION GAP: 3 — AB (ref 5–15)
BUN: 21 mg/dL — ABNORMAL HIGH (ref 6–20)
CO2: 24 mmol/L (ref 22–32)
Calcium: 8.6 mg/dL — ABNORMAL LOW (ref 8.9–10.3)
Chloride: 115 mmol/L — ABNORMAL HIGH (ref 101–111)
Creatinine, Ser: 1.12 mg/dL — ABNORMAL HIGH (ref 0.44–1.00)
GFR calc Af Amer: 53 mL/min — ABNORMAL LOW (ref >60)
GFR, EST NON AFRICAN AMERICAN: 46 mL/min — AB (ref >60)
GLUCOSE: 95 mg/dL (ref 65–99)
POTASSIUM: 5.2 mmol/L — AB (ref 3.5–5.1)
SODIUM: 142 mmol/L (ref 135–145)

## 2017-04-06 LAB — GLUCOSE, CAPILLARY
GLUCOSE-CAPILLARY: 84 mg/dL (ref 65–99)
GLUCOSE-CAPILLARY: 141 mg/dL — AB (ref 65–99)
Glucose-Capillary: 93 mg/dL (ref 65–99)

## 2017-04-06 MED ORDER — SENNA 8.6 MG PO TABS
1.0000 | ORAL_TABLET | Freq: Every day | ORAL | Status: DC
  Administered 2017-04-06: 11:00:00 8.6 mg via ORAL
  Filled 2017-04-06: qty 1

## 2017-04-06 MED ORDER — DOCUSATE SODIUM 100 MG PO CAPS
200.0000 mg | ORAL_CAPSULE | Freq: Two times a day (BID) | ORAL | Status: DC
  Administered 2017-04-06: 200 mg via ORAL
  Filled 2017-04-06: qty 2

## 2017-04-06 NOTE — Care Management Note (Signed)
Case Management Note  Patient Details  Name: Brailynn Breth MRN: [id number] Date of Birth: 05-May-1941  Subjective/Objective:     Admitted to [facility] under observation status with the diagnosis of hypoglycemia. Lives with daughter, Barnetta Chapel [phone]). Last seen Dr. Raechel Ache 12/2016. Prescriptions are filled at [facility] in [place]. Followed by [facility] in the home. [place] x 1 day. Been home from [facility] x 1 week. No home oxygen. CPAP, hospital bed,  Wheelchair, and rollayor in the home. Needs help with baths and dressing, sometimes with feeds. [place] [facility]. Eats good, but small meals.               Action/Plan: Physical therapy evaluation completed. Recommending skilled nursing. Daughter states she does better at home Will get resumption of services for [facility] when discharged,   Expected Discharge Date:  04/05/17               Expected Discharge Plan:     In-House Referral:     Discharge planning Services     Post Acute Care Choice:    Choice offered to:     DME Arranged:    DME Agency:     HH Arranged:    [place] Agency:     Status of Service:     If discussed at [employer], dates discussed:    Additional Comments:  Shelbie Ammons, [place] Management [phone] 04/06/2017, 12:37 PM

## 2017-04-06 NOTE — Discharge Summary (Addendum)
[place] at [facility], 76 y.o., DOB July 05, 1941, MRN [id number]. Admission date: 04/04/2017 Discharge Date 04/06/2017 Primary MD Ezequiel Kayser, MD Admitting Physician Baxter Hire, MD  Admission Diagnosis  Hyperkalemia [E87.5] Hypoglycemia [E16.2]  Discharge Diagnosis   Active Problems:   Hypoglycemia   Acute kidney injury with chronic kidney disease stage III  Acute encephalopathy due to hyperglycemia   hyperkalemia  Diabetes type 2   Essential hypertension   Diabetic neuropathy   Dementia        Hospital Course Pt is a 76 year old female. She has a history of diabetes and chronic diabetic neuropathy. Her daughter who is her caretaker says she's not been eating and drinking as well last few days. Yesterday her physical therapist noted her blood pressure was a little low. Today she is found to be less responsive, clammy location and EMS was called and she was found to have a blood sugar of 50. Patient is on Amaryl at home this was discontinued. Her blood sugars are now stable. I have recommended patient his daughter not give her this. Also she was noted to have acute renal failure on chronic renal failure her Lasix was held given IV fluids renal function much improved.  She was seen in consultation by physical therapy who recommended a skilled nursing facility however daughter states that she does better at home and would like her to go home.             Consults  None  Significant Tests:  See full reports for all details     Dg Abd 2 Views  Result Date: 04/05/2017 CLINICAL DATA:  Vomiting all morning.  Abdominal pain. EXAM: ABDOMEN - 2 VIEW COMPARISON:  CT, 12/04/2009 FINDINGS: There is no bowel dilation to suggest obstruction or generalized adynamic ileus. Colonic stool appears mildly increased. No evidence of a renal or ureteral stone. Vena cava filter has its proximal and at the upper endplate level of L3. No acute  skeletal abnormality. IMPRESSION: 1. No acute findings.  No evidence of bowel obstruction. 2. Mild increased stool throughout the colon. Electronically Signed   By: Lajean Manes M.D.   On: 04/05/2017 12:07       Today   Subjective:   Beryle Beams    Patient is confused but no complaintsare based    Blood pressure (!) 127/57, pulse 62, temperature 98 F (36.7 C), temperature source Oral, resp. rate 16, height 5\' 2"  (1.575 m), weight 171 lb 8 oz (77.8 kg), SpO2 99 %.  .  Intake/Output Summary (Last 24 hours) at 04/06/17 1428 Last data filed at 04/06/17 1200  Gross per 24 hour  Intake             1795 ml  Output              200 ml  Net             1595 ml    Exam VITAL SIGNS: Blood pressure (!) 127/57, pulse 62, temperature 98 F (36.7 C), temperature source Oral, resp. rate 16, height 5\' 2"  (1.575 m), weight 171 lb 8 oz (77.8 kg), SpO2 99 %.  GENERAL:  76 y.o.-year-old patient lying in the bed with no acute distress.  EYES: Pupils equal, round, reactive to light and accommodation. No scleral icterus. Extraocular muscles intact.  HEENT: Head atraumatic, normocephalic. Oropharynx and nasopharynx clear.  NECK:  Supple, no jugular venous distention. No thyroid enlargement, no tenderness.  LUNGS: Normal breath sounds bilaterally, no wheezing, rales,rhonchi or crepitation. No use of accessory muscles of respiration.  CARDIOVASCULAR: S1, S2 normal. No murmurs, rubs, or gallops.  ABDOMEN: Soft, nontender, nondistended. Bowel sounds present. No organomegaly or mass.  EXTREMITIES: No pedal edema, cyanosis, or clubbing.  NEUROLOGIC: Cranial nerves II through XII are intact. Muscle strength 5/5 in all extremities. Sensation intact. Gait not checked.  PSYCHIATRIC: The patient is alert and oriented x 3.  SKIN: No obvious rash, lesion, or ulcer.   Data Review     CBC w Diff:  Lab Results  Component Value Date   WBC 9.2 04/04/2017   HGB 10.9 (L) 04/04/2017   HGB 13.2 11/22/2014    HCT 32.6 (L) 04/04/2017   HCT 40.4 11/22/2014   PLT 151 04/04/2017   PLT 172 11/22/2014   LYMPHOPCT 21 12/12/2015   LYMPHOPCT 20.6 11/22/2014   MONOPCT 9 12/12/2015   MONOPCT 8.9 11/22/2014   EOSPCT 10 12/12/2015   EOSPCT 3.2 11/22/2014   BASOPCT 1 12/12/2015   BASOPCT 0.6 11/22/2014   CMP:  Lab Results  Component Value Date   NA 142 04/06/2017   NA 137 11/22/2014   K 5.2 (H) 04/06/2017   K 4.4 11/22/2014   CL 115 (H) 04/06/2017   CL 100 (L) 11/22/2014   CO2 24 04/06/2017   CO2 29 11/22/2014   BUN 21 (H) 04/06/2017   BUN 24 (H) 11/22/2014   CREATININE 1.12 (H) 04/06/2017   CREATININE 0.78 11/22/2014   PROT 6.7 04/04/2017   PROT 7.7 11/22/2014   ALBUMIN 3.3 (L) 04/04/2017   ALBUMIN 4.4 11/22/2014   BILITOT 0.4 04/04/2017   BILITOT 0.6 11/22/2014   ALKPHOS 55 04/04/2017   ALKPHOS 68 11/22/2014   AST 21 04/04/2017   AST 21 11/22/2014   ALT 15 04/04/2017   ALT 17 11/22/2014  .  Micro Results No results found for this or any previous visit (from the past 240 hour(s)).      Code Status Orders        Start     Ordered   04/04/17 1327  Full code  Continuous     04/04/17 1326    Code Status History    Date Active Date Inactive Code Status Order ID Comments User Context   This patient has a current code status but no historical code status.          Follow-up Information    Ezequiel Kayser, MD Follow up in 1 week(s).   Specialty:  Internal Medicine Contact information: [place] [address] [phone]           Discharge Medications   Allergies as of 04/06/2017      Reactions   Celebrex [celecoxib] Other (See Comments)   Metformin And Related Diarrhea   Tramadol Nausea And Vomiting   Voltaren [diclofenac Sodium] Nausea And Vomiting      Medication List    STOP taking these medications   furosemide 40 MG tablet Commonly known as:  LASIX   glimepiride 2 MG tablet Commonly known as:  AMARYL      TAKE these medications   acetaminophen 500 MG tablet Commonly known as:  TYLENOL Take 1,000 mg by mouth daily.   albuterol 108 (90 Base) MCG/ACT inhaler Commonly known as:  PROVENTIL HFA;VENTOLIN HFA Inhale 2 puffs into the lungs every 6 (six) hours as needed for wheezing or shortness of breath.   anastrozole 1 MG tablet Commonly  known as:  ARIMIDEX Take 1 tablet (1 mg total) by mouth daily.   aspirin EC 81 MG tablet Take 81 mg by mouth daily.   atorvastatin 20 MG tablet Commonly known as:  LIPITOR Take 20 mg by mouth daily.   baclofen 10 MG tablet Commonly known as:  LIORESAL Take 10 mg by mouth 2 (two) times daily.   calcium carbonate 600 MG Tabs tablet Commonly known as:  OS-CAL Take 600 mg by mouth 2 (two) times daily with a meal.   carvedilol 12.5 MG tablet Commonly known as:  COREG Take 12.5 mg by mouth 2 (two) times daily with a meal.   fluticasone 50 MCG/ACT nasal spray Commonly known as:  FLONASE Place 2 sprays into both nostrils daily.   FOSAMAX 70 MG tablet Generic drug:  alendronate Take 70 mg by mouth once a week. Take with a full glass of water on an empty stomach.   gabapentin 300 MG capsule Commonly known as:  NEURONTIN Take 600 mg by mouth 3 (three) times daily.   glucose blood test strip 1 each by Other route as needed for other. Use as instructed   levothyroxine 50 MCG tablet Commonly known as:  SYNTHROID, LEVOTHROID Take 50 mcg by mouth daily before breakfast.   losartan 100 MG tablet Commonly known as:  COZAAR Take 100 mg by mouth daily.   metFORMIN 500 MG 24 hr tablet Commonly known as:  GLUCOPHAGE-XR Take 1,000 mg by mouth daily with breakfast.   MULTIPLE VITAMIN-FOLIC ACID PO Take 1 tablet by mouth daily.   omeprazole 20 MG capsule Commonly known as:  PRILOSEC Take 20 mg by mouth daily.   traZODone 50 MG tablet Commonly known as:  DESYREL Take 25 mg by mouth at bedtime.   vitamin C 500 MG tablet Commonly known as:   ASCORBIC ACID Take 500 mg by mouth daily.          Total Time in preparing paper work, data evaluation and todays exam - 35 minutes  Dustin Flock M.D on 04/06/2017 at 2:28 [facility]  [facility]   Office  [phone]

## 2017-04-06 NOTE — Progress Notes (Signed)
Discharge instructions discussed with pts dtr.  No new presc . Home meds discussed.  Diet / activity and f/u discussed.  Pt  Has appt tomorrow with dr Raechel Ache.  Pt alert and responsive. No resp distress.  Ready for discharge out via w/c at this time

## 2017-04-06 NOTE — Plan of Care (Signed)
Problem: Pain Managment: Goal: General experience of comfort will improve Outcome: Progressing Denies pain  Problem: Activity: Goal: Risk for activity intolerance will decrease Outcome: Progressing P.t. Saw and pt  In chair .  Problem: Nutrition: Goal: Adequate nutrition will be maintained Outcome: Progressing Assisted with feed.

## 2017-04-06 NOTE — Care Management Obs Status (Signed)
[place] NOTIFICATION   Patient Details  Name: Danuta Huseman MRN: [id number] Date of Birth: [birth year]   Medicare Observation Status Notification Given:  Yes. Telephone conversation with daughter, Barnetta Chapel.     Shelbie Ammons, RN 04/06/2017, 12:46 PM

## 2017-04-06 NOTE — Discharge Instructions (Signed)
[place] at [place]:  Regular diet, carbohydrate controlled  DISCHARGE CONDITION:  Stable  ACTIVITY:  Activity as tolerated  OXYGEN:  Home Oxygen: No.   Oxygen Delivery: room air  DISCHARGE LOCATION:  home    ADDITIONAL DISCHARGE INSTRUCTION:   If you experience worsening of your admission symptoms, develop shortness of breath, life threatening emergency, suicidal or homicidal thoughts you must seek medical attention immediately by calling 911 or calling your MD immediately  if symptoms less severe.  You Must read complete instructions/literature along with all the possible adverse reactions/side effects for all the Medicines you take and that have been prescribed to you. Take any new Medicines after you have completely understood and accpet all the possible adverse reactions/side effects.   Please note  You were cared for by a hospitalist during your hospital stay. If you have any questions about your discharge medications or the care you received while you were in the hospital after you are discharged, you can call the unit and asked to speak with the hospitalist on call if the hospitalist that took care of you is not available. Once you are discharged, your primary care physician will handle any further medical issues. Please note that NO REFILLS for any discharge medications will be authorized once you are discharged, as it is imperative that you return to your primary care physician (or establish a relationship with a primary care physician if you do not have one) for your aftercare needs so that they can reassess your need for medications and monitor your lab values.

## 2017-04-06 NOTE — Care Management (Signed)
Discharge to home today per Dr. Posey Pronto. Telephone call to daughter, Barnetta Chapel. Discussed that [place] has been notified about discharge, States she will come after 4:00pm to pick her mother up Shelbie Ammons RN MSN [place] [facility] [phone]

## 2017-04-06 NOTE — Progress Notes (Signed)
Inpatient Diabetes Program Recommendations  AACE/ADA: New Consensus Statement on Inpatient Glycemic Control (2015)  Target Ranges:  Prepandial:   less than 140 mg/dL      Peak postprandial:   less than 180 mg/dL (1-2 hours)      Critically ill patients:  140 - 180 mg/dL   Lab Results  Component Value Date   GLUCAP 93 04/06/2017    Review of Glycemic ControlResults for MEERA, VASCO (MRN [id number]) as of 04/06/2017 13:15  Ref. Range 04/05/2017 12:33 04/05/2017 16:54 04/05/2017 21:10 04/06/2017 07:49 04/06/2017 12:08  Glucose-Capillary Latest Ref Range: 65 - 99 mg/dL 121 (H) 127 (H) 116 (H) 84 93    Diabetes history: Type 2 diabetes Outpatient Diabetes medications: Amaryl 2 mg with breakfast, Metformin 1000 mg daily Current orders for Inpatient glycemic control:  Novolog moderate tid with meals and HS  Inpatient Diabetes Program Recommendations:    Note patient admitted with hypoglycemia and c/o less PO intake and ARF.  Please consider d/c of Amaryl upon d/c home due to risk of low blood sugars.  Patient likely will not be able to take Metformin either due to elevated Creatinine/decreased GFR. May consider Tradjenta 5 mg daily as diabetes oral agent since it does not cause hypoglycemia and is not renally cleared.   No family currently available.  Attempted to call daughter on home phone but unable to reach.  Will follow-up on 8/21.    Thanks, Adah Perl, RN, BC-ADM Inpatient Diabetes Coordinator Pager [phone] (8a-5p)

## 2017-04-06 NOTE — Evaluation (Signed)
Physical Therapy Evaluation Patient Details Name: Crystal Frederick MRN: [id number] DOB: 1941/06/26 Today's Date: 04/06/2017   History of Present Illness  Pt is a 76 yo F admitted to acute care for hypoglycemia on 8/18. Pt is poor historiad, due to increased levels of confusion. Difficult to assess prior baseline function. PMH: arthritis, cancer, diabetes mellitus, DVT, gout, HTN, multinodular goiter, obesity, peripheral neuropathy, PVD, sleep apnea, and thrombocytopenia.   Clinical Impression  Pt is pleasantly confused, but willing to participate for return to PLOF. Pt able to follow one-step commands, with tactile/visual cues. Pt performs bed mobility with MinA, tranfers with CGA, and ambulation with CGA, amb total of 5 ft with RW. Pt is with poor safety awareness, requiring heavy cuing for obstacle navigation, spatial awareness, safety, and generalized mechanics. Pt presents with the following deficits: strength, endurance, balance, and safety awareness. Overall, pt responded well to today's treatment with no adverse affects. Pt would benefit from skilled PT to address the previously mentioned impairments and promote return to PLOF. Currently recommending SNF, pending d/c. This entire session was guided, instructed, and directly supervised by Greggory Stallion, DPT.      Follow Up Recommendations SNF    Equipment Recommendations  None recommended by PT    Recommendations for Other Services       Precautions / Restrictions Precautions Precautions: Fall Restrictions Weight Bearing Restrictions: No      Mobility  Bed Mobility Overal bed mobility: Needs Assistance Bed Mobility: Supine to Sit     Supine to sit: Min assist     General bed mobility comments: MinA with bed mobility, as pt with difficulty following commands. Pt requires tactile and verbal cues to initiate task. Pt requires HHA and use of R rail to come to sit adn max cues for scooting to EOB.   Transfers Overall  transfer level: Needs assistance Equipment used: Rolling walker (2 wheeled) Transfers: Sit to/from Stand Sit to Stand: Min guard         General transfer comment: CGA with STS, requiring max 1 step commands and visual/tactile cues to initiate task.   Ambulation/Gait Ambulation/Gait assistance: Min guard Ambulation Distance (Feet): 5 Feet Assistive device: Rolling walker (2 wheeled) Gait Pattern/deviations: Shuffle     General Gait Details: Pt with shuffling gait pattern. Rquiring CGA, but is with poor safety awareness and requires max cues for obstacle navigation, spacial awareness, mechanics, and safety. tactile cues required to safety maneuver RW for pt to back up to chair and use hands to reach for chair rails.   Stairs            Wheelchair Mobility    Modified Rankin (Stroke Patients Only)       Balance Overall balance assessment: Needs assistance Sitting-balance support: Bilateral upper extremity supported;Feet supported Sitting balance-Leahy Scale: Fair Sitting balance - Comments: Pt with fair sitting balance, initially losing balance and requiring minA to maintain, but progressing to CGA with verbal and tactile cues to sit with erect posture. Increased postural sway, but able to self correct once seated upright with ft on floor.    Standing balance support: Bilateral upper extremity supported Standing balance-Leahy Scale: Fair Standing balance comment: Fair standing balance, requiring B UE support. Increased postural sway, requiring CGA, but pt is able to self correct.                              Pertinent Vitals/Pain Pain Assessment: No/denies pain  Home Living Family/patient expects to be discharged to:: Private residence Living Arrangements: Children Available Help at Discharge: Family Type of Home: House Home Access: Stairs to enter Entrance Stairs-Rails: Right;Left;Can reach both Technical brewer of Steps: [place]: Able to  live on main level with bedroom/bathroom   Additional Comments: Pt was highly confused and daughter not present to confirm that this is accurate history. Per nursing, pt lives with daughter.     Prior Function           Comments: Unclear of level of assistance needed prior to admission, as pt is highly confused. Per nursing pt lives with daughter. Pt states she amb with a RW prior to admission.      Hand Dominance        Extremity/Trunk Assessment   Upper Extremity Assessment Upper Extremity Assessment: Overall WFL for tasks assessed    Lower Extremity Assessment Lower Extremity Assessment: Generalized weakness (MMT to B LE's grossly 4/5)       Communication   Communication: Other (comment) (Confused)  Cognition Arousal/Alertness: Awake/alert Behavior During Therapy: Impulsive Overall Cognitive Status: No family/caregiver present to determine baseline cognitive functioning                                 General Comments: Pt confused, and unabel to answer questions appropriately; only oriented to self. Able to state birthday correctly. Intermittently able to follow 1-step command. Requires tactile and visual cues to initiate task.       General Comments      Exercises Other Exercises Other Exercises: SLR performed to B LE's x10 reps with supervision and max verbal/visual/tactile cues.  Other Exercises: Diaper changing in bed rolling L<>R: requires minA with rolling and max cues, due to confusion.    Assessment/Plan    PT Assessment Patient needs continued PT services  PT Problem List Decreased strength;Decreased activity tolerance;Decreased balance;Decreased mobility;Decreased coordination;Decreased cognition;Decreased safety awareness       PT Treatment Interventions DME instruction;Gait training;Functional mobility training;Therapeutic activities;Stair training;Therapeutic exercise;Balance training;Neuromuscular re-education;Patient/family  education;Manual techniques    PT Goals (Current goals can be found in the Care Plan section)  Acute Rehab PT Goals Patient Stated Goal: unable to state PT Goal Formulation: With patient Time For Goal Achievement: 04/20/17 Potential to Achieve Goals: Good    Frequency Min 2X/week   Barriers to discharge        Co-evaluation               AM-PAC PT "6 Clicks" Daily Activity  Outcome Measure Difficulty turning over in bed (including adjusting bedclothes, sheets and blankets)?: Unable Difficulty moving from lying on back to sitting on the side of the bed? : Unable Difficulty sitting down on and standing up from a chair with arms (e.g., wheelchair, bedside commode, etc,.)?: Unable Help needed moving to and from a bed to chair (including a wheelchair)?: A Little Help needed walking in hospital room?: A Little Help needed climbing 3-5 steps with a railing? : A Lot 6 Click Score: 11    End of Session Equipment Utilized During Treatment: Gait belt Activity Tolerance: Patient tolerated treatment well Patient left: in chair;with call bell/phone within reach;with chair alarm set Nurse Communication: Mobility status PT Visit Diagnosis: Unsteadiness on feet (R26.81);Other abnormalities of gait and mobility (R26.89);Muscle weakness (generalized) (M62.81)    Time: 3354-5625 PT Time Calculation (min) (ACUTE ONLY): 18 min   Charges:  PT G Codes:        Oran Rein PT, SPT  Bevelyn Ngo 04/06/2017, 11:57 AM

## 2017-04-07 NOTE — Progress Notes (Signed)
Nutrition Brief Note  During patient's admission had received report from RN that daughter wanted to talk to RD regarding patient's diet for DM. Stopped in to see patient multiple times on 8/19 and 8/20, but daughter was never there. Left office number with RN to provide to daughter as patient was to discharge on 8/20.  Received call from daughter today regarding patient's diet. Discussed different food groups and their effects on blood sugar, emphasizing carbohydrate-containing foods. Discussed recommended serving sizes of common foods.  Discussed importance of controlled and consistent carbohydrate intake throughout the day. Provided examples of ways to balance meals/snacks and encouraged intake of high-fiber, whole grain complex carbohydrates.  Mailed "Carbohydrate Counting for Diabetes" and "Using Nutrition Labels: Carbohydrates" handouts from the [employer] to patient's daughter. Encouraged her to call back with any questions.   Willey Blade, MS, RD, LDN Pager: [phone] After Hours Pager: [phone]

## 2017-04-07 NOTE — Progress Notes (Signed)
Inpatient Diabetes Program Recommendations  AACE/ADA: New Consensus Statement on Inpatient Glycemic Control (2015)  Target Ranges:  Prepandial:   less than 140 mg/dL      Peak postprandial:   less than 180 mg/dL (1-2 hours)      Critically ill patients:  140 - 180 mg/dL   Lab Results  Component Value Date   GLUCAP 141 (H) 04/06/2017    Review of Glycemic Control  Received page from [facility] RN regarding calling caregiver questions regarding discharge orders and when to check blood sugars. Pt is going to MD office visit today. Instructed her to ask MD for prescription for glucose meter with strips and lancets. Check blood sugars 2x/day and record in logbook. Take logbook to MD appts for any needed adjustments in meds.  Amaryl has been discontinued. Metformin 1000 mg QAM is only DM med. Encouraged to speak with MD about metformin making pt feel nauseated.  Thank you. Lorenda Peck, RD, LDN, CDE Inpatient Diabetes Coordinator [phone]

## 2017-05-05 ENCOUNTER — Inpatient Hospital Stay: Payer: Medicare Other

## 2017-05-05 ENCOUNTER — Inpatient Hospital Stay: Payer: Medicare Other | Admitting: Hematology and Oncology

## 2017-05-05 NOTE — Progress Notes (Unsigned)
[facility] day:  05/05/17   Chief Complaint: Crystal Frederick is a 76 y.o. female with a history of bilateral breast cancer who is seen for 16 month assessment.  HPI: The patient was last seen in the medical oncology clinic on 12/12/2015.  At that time, she denied any breast concerns.  She had bilateral hand pain (right > left).  Exam revealed post-operative changes.  Labs were normal.  She continued calcium, vitamin D, and Fosamax for osteoporosis.  We discussed BCI testing.  During the interim,   Past Medical History:  Diagnosis Date  . Arthritis   . Cancer ([place])   . Diabetes mellitus without complication ([place])   . DVT (deep venous thrombosis) ([place])   . Gout   . Hypertension   . Multinodular goiter   . Obesity   . Peripheral neuropathy   . Peripheral neuropathy   . Peripheral vascular disease ([place])   . Pure hypercholesterolemia   . Sleep apnea   . Thrombocytopenia ([place])   . Vitamin D deficiency     Past Surgical History:  Procedure Laterality Date  . BREAST SURGERY    . COLONOSCOPY WITH PROPOFOL N/A 03/05/2015   Procedure: COLONOSCOPY WITH PROPOFOL;  Surgeon: Manya Silvas, MD;  Location: [facility] ENDOSCOPY;  Service: Endoscopy;  Laterality: N/A;  . JOINT REPLACEMENT    . MASTECTOMY    . WISDOM TOOTH EXTRACTION      Family History  Problem Relation Age of Onset  . Hypertension Mother   . Cancer Mother        Breast  . Cancer Father        Leukemia  . Cancer Maternal Aunt        UNsure of type    Social History:  reports that she has quit smoking. She has never used smokeless tobacco. She reports that she does not drink alcohol or use drugs.  The patient is alone today.  Allergies:  Allergies  Allergen Reactions  . Celebrex [Celecoxib] Other (See Comments)  . Metformin And Related Diarrhea  . Tramadol Nausea And Vomiting  . Voltaren [Diclofenac Sodium] Nausea And Vomiting    Current Medications: Current  Outpatient Prescriptions  Medication Sig Dispense Refill  . acetaminophen (TYLENOL) 500 MG tablet Take 1,000 mg by mouth daily.    Marland Kitchen albuterol (PROVENTIL HFA;VENTOLIN HFA) 108 (90 BASE) MCG/ACT inhaler Inhale 2 puffs into the lungs every 6 (six) hours as needed for wheezing or shortness of breath.    Marland Kitchen alendronate (FOSAMAX) 70 MG tablet Take 70 mg by mouth once a week. Take with a full glass of water on an empty stomach.    Marland Kitchen anastrozole (ARIMIDEX) 1 MG tablet Take 1 tablet (1 mg total) by mouth daily. (Patient not taking: Reported on 04/04/2017) 30 tablet 0  . aspirin EC 81 MG tablet Take 81 mg by mouth daily.    Marland Kitchen atorvastatin (LIPITOR) 20 MG tablet Take 20 mg by mouth daily.    . baclofen (LIORESAL) 10 MG tablet Take 10 mg by mouth 2 (two) times daily.     . calcium carbonate (OS-CAL) 600 MG TABS tablet Take 600 mg by mouth 2 (two) times daily with a meal.    . carvedilol (COREG) 12.5 MG tablet Take 12.5 mg by mouth 2 (two) times daily with a meal.    . fluticasone (FLONASE) 50 MCG/ACT nasal spray Place 2 sprays into both nostrils daily.    Marland Kitchen gabapentin (NEURONTIN)  300 MG capsule Take 600 mg by mouth 3 (three) times daily.     Marland Kitchen glucose blood test strip 1 each by Other route as needed for other. Use as instructed    . levothyroxine (SYNTHROID, LEVOTHROID) 50 MCG tablet Take 50 mcg by mouth daily before breakfast.    . losartan (COZAAR) 100 MG tablet Take 100 mg by mouth daily.    . metFORMIN (GLUCOPHAGE-XR) 500 MG 24 hr tablet Take 1,000 mg by mouth daily with breakfast.     . MULTIPLE VITAMIN-FOLIC ACID PO Take 1 tablet by mouth daily.    Marland Kitchen omeprazole (PRILOSEC) 20 MG capsule Take 20 mg by mouth daily.    . traZODone (DESYREL) 50 MG tablet Take 25 mg by mouth at bedtime.    . vitamin C (ASCORBIC ACID) 500 MG tablet Take 500 mg by mouth daily.     No current facility-administered medications for this visit.     Review of Systems:  GENERAL:  Feels "ok".  No fevers or sweats.  Weight up 13  pounds. PERFORMANCE STATUS (ECOG):  1 HEENT:  No visual changes, runny nose, sore throat, mouth sores or tenderness.  Lungs: No shortness of breath or cough.  No hemoptysis. Cardiac:  No chest pain, palpitations, orthopnea, or PND. GI:  No nausea, vomiting, diarrhea, constipation, melena or hematochezia.  Colonoscopy 03/05/2015. GU:  No urgency, frequency, dysuria, or hematuria. Musculoskeletal:  Aches all over. No muscle tenderness. Extremities:  Sharp pains in hands (right > left).  No pain or swelling. Skin:  No rashes or skin changes. Neuro:  No headache, numbness or weakness, balance or coordination issues. Endocrine:  Diabetes.  Thyroid disease on Synthroid.  No hot flashes or night sweats. Psych:  No mood changes, depression or anxiety. Pain:  No focal pain. Review of systems:  All other systems reviewed and found to be negative.  Physical Exam: There were no vitals taken for this visit. GENERAL:  Well developed, well nourished, sitting comfortably in the exam room in no acute distress.  She has a cane at her side. MENTAL STATUS:  Alert and oriented to person, place and time. HEAD:  Short dark hair with bangs.  Normocephalic, atraumatic, face symmetric, no Cushingoid features. EYES:  Hazel/brown eyes.  Pupils equal round and reactive to light and accomodation. No conjunctivitis or scleral icterus. ENT: Oropharynx clear without lesion. Tongue normal. Mucous membranes moist.  RESPIRATORY: Clear to auscultation without rales, wheezes or rhonchi. CARDIOVASCULAR: Regular rate and rhythm without murmur, rub or gallop. BREAST: Right mastectomy with well healed incision without erythema or nodularity. Left chest with significant post-op changes and indentation at 2 o'clock position. No discrete masses, skin changes or nipple discharge.  ABDOMEN: Soft, non-tender, with active bowel sounds, and no hepatosplenomegaly. No masses. SKIN: No rashes, ulcers or lesions. EXTREMITIES:  Left lower extremity chronic edema.  No skin discoloration or tenderness. No palpable cords. LYMPH NODES: No palpable cervical, supraclavicular, axillary or inguinal adenopathy  NEUROLOGICAL: Unremarkable. PSYCH: Appropriate.   No visits with results within 3 Day(s) from this visit.  Latest known visit with results is:  Admission on 04/04/2017, Discharged on 04/06/2017  Component Date Value Ref Range Status  . WBC 04/04/2017 9.2  3.6 - 11.0 K/uL Final  . RBC 04/04/2017 3.72* 3.80 - 5.20 MIL/uL Final  . Hemoglobin 04/04/2017 10.9* 12.0 - 16.0 g/dL Final  . HCT 04/04/2017 32.6* 35.0 - 47.0 % Final  . MCV 04/04/2017 87.5  80.0 - 100.0 fL Final  .  MCH 04/04/2017 29.2  26.0 - 34.0 pg Final  . MCHC 04/04/2017 33.4  32.0 - 36.0 g/dL Final  . RDW 04/04/2017 15.4* 11.5 - 14.5 % Final  . Platelets 04/04/2017 151  150 - 440 K/uL Final  . Sodium 04/04/2017 137  135 - 145 mmol/L Final  . Potassium 04/04/2017 6.0* 3.5 - 5.1 mmol/L Final  . Chloride 04/04/2017 106  101 - 111 mmol/L Final  . CO2 04/04/2017 24  22 - 32 mmol/L Final  . Glucose, Bld 04/04/2017 140* 65 - 99 mg/dL Final  . BUN 04/04/2017 31* 6 - 20 mg/dL Final  . Creatinine, Ser 04/04/2017 1.67* 0.44 - 1.00 mg/dL Final  . Calcium 04/04/2017 8.9  8.9 - 10.3 mg/dL Final  . Total Protein 04/04/2017 6.7  6.5 - 8.1 g/dL Final  . Albumin 04/04/2017 3.3* 3.5 - 5.0 g/dL Final  . AST 04/04/2017 21  15 - 41 U/L Final  . ALT 04/04/2017 15  14 - 54 U/L Final  . Alkaline Phosphatase 04/04/2017 55  38 - 126 U/L Final  . Total Bilirubin 04/04/2017 0.4  0.3 - 1.2 mg/dL Final  . GFR calc non Af Amer 04/04/2017 28* >60 mL/min Final  . GFR calc Af Amer 04/04/2017 33* >60 mL/min Final   Comment: (NOTE) The eGFR has been calculated using the CKD EPI equation. This calculation has not been validated in all clinical situations. eGFR's persistently <60 mL/min signify possible Chronic Kidney Disease.   . Anion gap 04/04/2017 7  5 - 15 Final  . Color,  Urine 04/04/2017 STRAW* YELLOW Final  . APPearance 04/04/2017 CLEAR* CLEAR Final  . Specific Gravity, Urine 04/04/2017 1.005  1.005 - 1.030 Final  . pH 04/04/2017 7.0  5.0 - 8.0 Final  . Glucose, UA 04/04/2017 NEGATIVE  NEGATIVE mg/dL Final  . Hgb urine dipstick 04/04/2017 NEGATIVE  NEGATIVE Final  . Bilirubin Urine 04/04/2017 NEGATIVE  NEGATIVE Final  . Ketones, ur 04/04/2017 NEGATIVE  NEGATIVE mg/dL Final  . Protein, ur 04/04/2017 NEGATIVE  NEGATIVE mg/dL Final  . Nitrite 04/04/2017 NEGATIVE  NEGATIVE Final  . Leukocytes, UA 04/04/2017 NEGATIVE  NEGATIVE Final  . RBC / HPF 04/04/2017 NONE SEEN  0 - 5 RBC/hpf Final  . WBC, UA 04/04/2017 0-5  0 - 5 WBC/hpf Final  . Bacteria, UA 04/04/2017 NONE SEEN  NONE SEEN Final  . Squamous Epithelial / LPF 04/04/2017 0-5* NONE SEEN Final  . Glucose-Capillary 04/04/2017 135* 65 - 99 mg/dL Final  . Glucose-Capillary 04/04/2017 85  65 - 99 mg/dL Final  . Glucose-Capillary 04/04/2017 97  65 - 99 mg/dL Final  . Potassium 04/04/2017 5.9* 3.5 - 5.1 mmol/L Final  . Glucose-Capillary 04/04/2017 101* 65 - 99 mg/dL Final  . Comment 1 04/04/2017 Notify RN   Final  . Comment 2 04/04/2017 Document in Chart   Final  . Glucose-Capillary 04/04/2017 79  65 - 99 mg/dL Final  . Comment 1 04/04/2017 Notify RN   Final  . Comment 2 04/04/2017 Document in Chart   Final  . Sodium 04/05/2017 137  135 - 145 mmol/L Final  . Potassium 04/05/2017 5.7* 3.5 - 5.1 mmol/L Final  . Chloride 04/05/2017 109  101 - 111 mmol/L Final  . CO2 04/05/2017 24  22 - 32 mmol/L Final  . Glucose, Bld 04/05/2017 81  65 - 99 mg/dL Final  . BUN 04/05/2017 27* 6 - 20 mg/dL Final  . Creatinine, Ser 04/05/2017 1.46* 0.44 - 1.00 mg/dL Final  . Calcium 04/05/2017  8.6* 8.9 - 10.3 mg/dL Final  . GFR calc non Af Amer 04/05/2017 33* >60 mL/min Final  . GFR calc Af Amer 04/05/2017 39* >60 mL/min Final   Comment: (NOTE) The eGFR has been calculated using the CKD EPI equation. This calculation has not  been validated in all clinical situations. eGFR's persistently <60 mL/min signify possible Chronic Kidney Disease.   . Anion gap 04/05/2017 4* 5 - 15 Final  . Glucose-Capillary 04/04/2017 106* 65 - 99 mg/dL Final  . Glucose-Capillary 04/05/2017 83  65 - 99 mg/dL Final  . TSH 04/05/2017 0.556  0.350 - 4.500 uIU/mL Final   Performed by a 3rd Generation assay with a functional sensitivity of <=0.01 uIU/mL.  Marland Kitchen Vitamin B-12 04/05/2017 252  180 - 914 pg/mL Final   Comment: (NOTE) This assay is not validated for testing neonatal or myeloproliferative syndrome specimens for Vitamin B12 levels. Performed at [facility], [place] [address] [place], [place] [address]   . Glucose-Capillary 04/05/2017 80  65 - 99 mg/dL Final  . Comment 1 04/05/2017 Notify RN   Final  . Comment 2 04/05/2017 Document in Chart   Final  . Glucose-Capillary 04/05/2017 121* 65 - 99 mg/dL Final  . Comment 1 04/05/2017 Notify RN   Final  . Comment 2 04/05/2017 Document in Chart   Final  . Sodium 04/06/2017 142  135 - 145 mmol/L Final  . Potassium 04/06/2017 5.2* 3.5 - 5.1 mmol/L Final  . Chloride 04/06/2017 115* 101 - 111 mmol/L Final  . CO2 04/06/2017 24  22 - 32 mmol/L Final  . Glucose, Bld 04/06/2017 95  65 - 99 mg/dL Final  . BUN 04/06/2017 21* 6 - 20 mg/dL Final  . Creatinine, Ser 04/06/2017 1.12* 0.44 - 1.00 mg/dL Final  . Calcium 04/06/2017 8.6* 8.9 - 10.3 mg/dL Final  . GFR calc non Af Amer 04/06/2017 46* >60 mL/min Final  . GFR calc Af Amer 04/06/2017 53* >60 mL/min Final   Comment: (NOTE) The eGFR has been calculated using the CKD EPI equation. This calculation has not been validated in all clinical situations. eGFR's persistently <60 mL/min signify possible Chronic Kidney Disease.   . Anion gap 04/06/2017 3* 5 - 15 Final  . Glucose-Capillary 04/05/2017 127* 65 - 99 mg/dL Final  . Comment 1 04/05/2017 Notify RN   Final  . Comment 2 04/05/2017 Document in Chart   Final  . Glucose-Capillary  04/05/2017 116* 65 - 99 mg/dL Final  . Glucose-Capillary 04/06/2017 84  65 - 99 mg/dL Final  . Glucose-Capillary 04/06/2017 93  65 - 99 mg/dL Final  . Glucose-Capillary 04/06/2017 141* 65 - 99 mg/dL Final    Assessment:  Crystal Frederick is a 76 y.o. 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  She presented in 04/1998 with a 4 x 2.2 x 2.2 cm right breast cancer.  She underwent lumpectomy.  Tumor was ER, PR, and Her2/neu negative.  She received adjuvant adriamycin and Cytoxan (AC) followed by Taxol.  She received radiation.  She developed recurrent disease in 12/2000.  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  She underwent modified right radical mastectomy.  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  She developed left breast cancer in 11/2009.  She underwent mastectomy in 12/2009.  Tumor was ER positive, PR positive, and  Her2/neu negative.  Oncotype score was 33 (high).  She received 6 cycles of Taxotere and Cytoxan (TC) from 01/29/2010 - 05/15/2010.  She completed left chest wall radiation in 07/2010.  Arimidex started in 07/2010.  She completed 5 years of therapy.  CA 27-29 was 30.9 (normal) on 05/23/2015 and 30.5 on 12/12/2015.  She fell in 10/2013 and sustained an orbital fracture.  CT face revealed a subpleural density in both upper lobes felt secondary to scarring.  She is being followed by Dr. Raul Del of pulmonary medicine.   She had an intermittent headache and a pressure sensation on the right side of her head and balance problem for 4-5 months.  Head MRI on 11/30/2014 revealed no metastatic disease. She had small areas of hemosiderin both anterior frontal lobes and also the right posterior temporal lobe sequela of trauma. There was chronic left sided MCA infarct. Cervical spine MRI on 11/30/2014 revealed subtle signal changes along the ventral C4 and  C5-C6 level suspicious for posterior anterior ligamentous injury. There was widespread cervical spine disc and endplate degeneration. There was no evidence of metastatic disease.  Bone density on 06/04/2015 at the [facility] revealed a osteoporosis with a T score of -3.2 in the forearm, -2.3 in the left femoral neck, and -0.9 in the left hip.  She is on Fosamax weekly.  She has a history of a normocytic anemia.  She has a history of Celebrex-induced gastritis/ulcer.  Colonoscopy on 03/05/2015 was "okay" per patient report. She denies any melena or hematochezia.  She eats meat about 3-4 times a week. She denies any pica.  Work-up on 06/20/2015 revealed the following normal studies: hematocrit (37.2), reticulocyte count, ferritin, B12, folate, and TSH.   Symptomatically, she denies any breast concerns.  She has bilateral hand pain (right > left).  Exam reveals post-operative changes.  Plan: 1.  Labs today:  CBC with diff, CMP, CA27.29.  2.  Discuss benefit of adjuvant hormonal therapy for 5 versus 10 years.  She has completed 5 years.  Discuss Breast Cancer Index (BCI) testing.  She is interested in testing. 3.  Continue calcium and vitamin D.  Patient on Fosamax for osteoporosis. 4.  RTC after BCI test results available.   Lequita Asal, MD  05/05/2017, 5:26 AM

## 2017-05-21 ENCOUNTER — Inpatient Hospital Stay: Payer: Medicare Other | Attending: Hematology and Oncology

## 2017-05-21 ENCOUNTER — Inpatient Hospital Stay: Payer: Medicare Other | Admitting: Hematology and Oncology

## 2017-05-21 NOTE — Progress Notes (Deleted)
[facility] day:  05/21/17   Chief Complaint: Crystal Frederick is a 76 y.o. female with a history of bilateral breast cancer who is seen for 17 month assessment.  HPI: The patient was last seen in the medical oncology clinic on 12/12/2015.  At that time, she denied any breast concerns.  She had bilateral hand pain (right > left).  Exam revealed post-operative changes.  Labs were normal.  She continued calcium, vitamin D, and Fosamax for osteoporosis.  We discussed BCI testing.  She has been followed by Dr. Wallene Huh.    Chest CT on 02/03/2017 revealed a spectrum of findings suggestive of fibrotic interstitial lung disease with mild honeycombing in the lingula. Findings have progressed since 12/04/2009 chest CT. Given the mild air trapping in both lungs, consider chronic hypersensitivity pneumonitis. Differential includes fibrotic nonspecific interstitial pneumonia.  There was a sharply marginated subpleural focus of consolidation in the peripheral apical left upper lobe, new since 12/04/2009 chest CT, most suggestive of radiation fibrosis. Recommend a follow-up chest CT in 3 months if there is no history of radiation.  There was nonspecific mildly thick walled fluid collection in the ventral left chest wall, probably a chronic postsurgical collection. There was mild cardiomegaly, left main and 3 vessel coronary atherosclerosis.  There was an ectatic 4.0 cm ascending thoracic aorta, minimally increased. Recommend annual imaging  During the interim,   Past Medical History:  Diagnosis Date  . Arthritis   . Cancer ([place])   . Diabetes mellitus without complication ([place])   . DVT (deep venous thrombosis) ([place])   . Gout   . Hypertension   . Multinodular goiter   . Obesity   . Peripheral neuropathy   . Peripheral neuropathy   . Peripheral vascular disease ([place])   . Pure hypercholesterolemia   . Sleep apnea   . Thrombocytopenia ([place])   .  Vitamin D deficiency     Past Surgical History:  Procedure Laterality Date  . BREAST SURGERY    . COLONOSCOPY WITH PROPOFOL N/A 03/05/2015   Procedure: COLONOSCOPY WITH PROPOFOL;  Surgeon: Manya Silvas, MD;  Location[facility] ENDOSCOPY;  Service: Endoscopy;  Laterality: N/A;  . JOINT REPLACEMENT    . MASTECTOMY    . WISDOM TOOTH EXTRACTION      Family History  Problem Relation Age of Onset  . Hypertension Mother   . Cancer Mother        Breast  . Cancer Father        Leukemia  . Cancer Maternal Aunt        UNsure of type    Social History:  reports that she has quit smoking. She has never used smokeless tobacco. She reports that she does not drink alcohol or use drugs.  The patient is alone today.  Allergies:  Allergies  Allergen Reactions  . Celebrex [Celecoxib] Other (See Comments)  . Metformin And Related Diarrhea  . Tramadol Nausea And Vomiting  . Voltaren [Diclofenac Sodium] Nausea And Vomiting    Current Medications: Current Outpatient Prescriptions  Medication Sig Dispense Refill  . acetaminophen (TYLENOL) 500 MG tablet Take 1,000 mg by mouth daily.    Marland Kitchen albuterol (PROVENTIL HFA;VENTOLIN HFA) 108 (90 BASE) MCG/ACT inhaler Inhale 2 puffs into the lungs every 6 (six) hours as needed for wheezing or shortness of breath.    Marland Kitchen alendronate (FOSAMAX) 70 MG tablet Take 70 mg by mouth once a week. Take with a full glass of  water on an empty stomach.    Marland Kitchen anastrozole (ARIMIDEX) 1 MG tablet Take 1 tablet (1 mg total) by mouth daily. (Patient not taking: Reported on 04/04/2017) 30 tablet 0  . aspirin EC 81 MG tablet Take 81 mg by mouth daily.    Marland Kitchen atorvastatin (LIPITOR) 20 MG tablet Take 20 mg by mouth daily.    . baclofen (LIORESAL) 10 MG tablet Take 10 mg by mouth 2 (two) times daily.     . calcium carbonate (OS-CAL) 600 MG TABS tablet Take 600 mg by mouth 2 (two) times daily with a meal.    . carvedilol (COREG) 12.5 MG tablet Take 12.5 mg by mouth 2 (two) times daily with a  meal.    . fluticasone (FLONASE) 50 MCG/ACT nasal spray Place 2 sprays into both nostrils daily.    Marland Kitchen gabapentin (NEURONTIN) 300 MG capsule Take 600 mg by mouth 3 (three) times daily.     Marland Kitchen glucose blood test strip 1 each by Other route as needed for other. Use as instructed    . levothyroxine (SYNTHROID, LEVOTHROID) 50 MCG tablet Take 50 mcg by mouth daily before breakfast.    . losartan (COZAAR) 100 MG tablet Take 100 mg by mouth daily.    . metFORMIN (GLUCOPHAGE-XR) 500 MG 24 hr tablet Take 1,000 mg by mouth daily with breakfast.     . MULTIPLE VITAMIN-FOLIC ACID PO Take 1 tablet by mouth daily.    Marland Kitchen omeprazole (PRILOSEC) 20 MG capsule Take 20 mg by mouth daily.    . traZODone (DESYREL) 50 MG tablet Take 25 mg by mouth at bedtime.    . vitamin C (ASCORBIC ACID) 500 MG tablet Take 500 mg by mouth daily.     No current facility-administered medications for this visit.     Review of Systems:  GENERAL:  Feels "ok".  No fevers or sweats.  Weight up 13 pounds. PERFORMANCE STATUS (ECOG):  1 HEENT:  No visual changes, runny nose, sore throat, mouth sores or tenderness.  Lungs: No shortness of breath or cough.  No hemoptysis. Cardiac:  No chest pain, palpitations, orthopnea, or PND. GI:  No nausea, vomiting, diarrhea, constipation, melena or hematochezia.  Colonoscopy 03/05/2015. GU:  No urgency, frequency, dysuria, or hematuria. Musculoskeletal:  Aches all over. No muscle tenderness. Extremities:  Sharp pains in hands (right > left).  No pain or swelling. Skin:  No rashes or skin changes. Neuro:  No headache, numbness or weakness, balance or coordination issues. Endocrine:  Diabetes.  Thyroid disease on Synthroid.  No hot flashes or night sweats. Psych:  No mood changes, depression or anxiety. Pain:  No focal pain. Review of systems:  All other systems reviewed and found to be negative.  Physical Exam: There were no vitals taken for this visit. GENERAL:  Well developed, well nourished,  sitting comfortably in the exam room in no acute distress.  She has a cane at her side. MENTAL STATUS:  Alert and oriented to person, place and time. HEAD:  Short dark hair with bangs.  Normocephalic, atraumatic, face symmetric, no Cushingoid features. EYES:  Hazel/brown eyes.  Pupils equal round and reactive to light and accomodation. No conjunctivitis or scleral icterus. ENT: Oropharynx clear without lesion. Tongue normal. Mucous membranes moist.  RESPIRATORY: Clear to auscultation without rales, wheezes or rhonchi. CARDIOVASCULAR: Regular rate and rhythm without murmur, rub or gallop. BREAST: Right mastectomy with well healed incision without erythema or nodularity. Left chest with significant post-op changes and indentation at 2  o'clock position. No discrete masses, skin changes or nipple discharge.  ABDOMEN: Soft, non-tender, with active bowel sounds, and no hepatosplenomegaly. No masses. SKIN: No rashes, ulcers or lesions. EXTREMITIES: Left lower extremity chronic edema.  No skin discoloration or tenderness. No palpable cords. LYMPH NODES: No palpable cervical, supraclavicular, axillary or inguinal adenopathy  NEUROLOGICAL: Unremarkable. PSYCH: Appropriate.   No visits with results within 3 Day(s) from this visit.  Latest known visit with results is:  Admission on 04/04/2017, Discharged on 04/06/2017  Component Date Value Ref Range Status  . WBC 04/04/2017 9.2  3.6 - 11.0 K/uL Final  . RBC 04/04/2017 3.72* 3.80 - 5.20 MIL/uL Final  . Hemoglobin 04/04/2017 10.9* 12.0 - 16.0 g/dL Final  . HCT 04/04/2017 32.6* 35.0 - 47.0 % Final  . MCV 04/04/2017 87.5  80.0 - 100.0 fL Final  . MCH 04/04/2017 29.2  26.0 - 34.0 pg Final  . MCHC 04/04/2017 33.4  32.0 - 36.0 g/dL Final  . RDW 04/04/2017 15.4* 11.5 - 14.5 % Final  . Platelets 04/04/2017 151  150 - 440 K/uL Final  . Sodium 04/04/2017 137  135 - 145 mmol/L Final  . Potassium 04/04/2017 6.0* 3.5 - 5.1 mmol/L Final  . Chloride  04/04/2017 106  101 - 111 mmol/L Final  . CO2 04/04/2017 24  22 - 32 mmol/L Final  . Glucose, Bld 04/04/2017 140* 65 - 99 mg/dL Final  . BUN 04/04/2017 31* 6 - 20 mg/dL Final  . Creatinine, Ser 04/04/2017 1.67* 0.44 - 1.00 mg/dL Final  . Calcium 04/04/2017 8.9  8.9 - 10.3 mg/dL Final  . Total Protein 04/04/2017 6.7  6.5 - 8.1 g/dL Final  . Albumin 04/04/2017 3.3* 3.5 - 5.0 g/dL Final  . AST 04/04/2017 21  15 - 41 U/L Final  . ALT 04/04/2017 15  14 - 54 U/L Final  . Alkaline Phosphatase 04/04/2017 55  38 - 126 U/L Final  . Total Bilirubin 04/04/2017 0.4  0.3 - 1.2 mg/dL Final  . GFR calc non Af Amer 04/04/2017 28* >60 mL/min Final  . GFR calc Af Amer 04/04/2017 33* >60 mL/min Final   Comment: (NOTE) The eGFR has been calculated using the CKD EPI equation. This calculation has not been validated in all clinical situations. eGFR's persistently <60 mL/min signify possible Chronic Kidney Disease.   . Anion gap 04/04/2017 7  5 - 15 Final  . Color, Urine 04/04/2017 STRAW* YELLOW Final  . APPearance 04/04/2017 CLEAR* CLEAR Final  . Specific Gravity, Urine 04/04/2017 1.005  1.005 - 1.030 Final  . pH 04/04/2017 7.0  5.0 - 8.0 Final  . Glucose, UA 04/04/2017 NEGATIVE  NEGATIVE mg/dL Final  . Hgb urine dipstick 04/04/2017 NEGATIVE  NEGATIVE Final  . Bilirubin Urine 04/04/2017 NEGATIVE  NEGATIVE Final  . Ketones, ur 04/04/2017 NEGATIVE  NEGATIVE mg/dL Final  . Protein, ur 04/04/2017 NEGATIVE  NEGATIVE mg/dL Final  . Nitrite 04/04/2017 NEGATIVE  NEGATIVE Final  . Leukocytes, UA 04/04/2017 NEGATIVE  NEGATIVE Final  . RBC / HPF 04/04/2017 NONE SEEN  0 - 5 RBC/hpf Final  . WBC, UA 04/04/2017 0-5  0 - 5 WBC/hpf Final  . Bacteria, UA 04/04/2017 NONE SEEN  NONE SEEN Final  . Squamous Epithelial / LPF 04/04/2017 0-5* NONE SEEN Final  . Glucose-Capillary 04/04/2017 135* 65 - 99 mg/dL Final  . Glucose-Capillary 04/04/2017 85  65 - 99 mg/dL Final  . Glucose-Capillary 04/04/2017 97  65 - 99 mg/dL Final   . Potassium 04/04/2017 5.9* 3.5 -  5.1 mmol/L Final  . Glucose-Capillary 04/04/2017 101* 65 - 99 mg/dL Final  . Comment 1 04/04/2017 Notify RN   Final  . Comment 2 04/04/2017 Document in Chart   Final  . Glucose-Capillary 04/04/2017 79  65 - 99 mg/dL Final  . Comment 1 04/04/2017 Notify RN   Final  . Comment 2 04/04/2017 Document in Chart   Final  . Sodium 04/05/2017 137  135 - 145 mmol/L Final  . Potassium 04/05/2017 5.7* 3.5 - 5.1 mmol/L Final  . Chloride 04/05/2017 109  101 - 111 mmol/L Final  . CO2 04/05/2017 24  22 - 32 mmol/L Final  . Glucose, Bld 04/05/2017 81  65 - 99 mg/dL Final  . BUN 04/05/2017 27* 6 - 20 mg/dL Final  . Creatinine, Ser 04/05/2017 1.46* 0.44 - 1.00 mg/dL Final  . Calcium 04/05/2017 8.6* 8.9 - 10.3 mg/dL Final  . GFR calc non Af Amer 04/05/2017 33* >60 mL/min Final  . GFR calc Af Amer 04/05/2017 39* >60 mL/min Final   Comment: (NOTE) The eGFR has been calculated using the CKD EPI equation. This calculation has not been validated in all clinical situations. eGFR's persistently <60 mL/min signify possible Chronic Kidney Disease.   . Anion gap 04/05/2017 4* 5 - 15 Final  . Glucose-Capillary 04/04/2017 106* 65 - 99 mg/dL Final  . Glucose-Capillary 04/05/2017 83  65 - 99 mg/dL Final  . TSH 04/05/2017 0.556  0.350 - 4.500 uIU/mL Final   Performed by a 3rd Generation assay with a functional sensitivity of <=0.01 uIU/mL.  Marland Kitchen Vitamin B-12 04/05/2017 252  180 - 914 pg/mL Final   Comment: (NOTE) This assay is not validated for testing neonatal or myeloproliferative syndrome specimens for Vitamin B12 levels. Performed at [facility], [place] [address], [place], [place] [address]   . Glucose-Capillary 04/05/2017 80  65 - 99 mg/dL Final  . Comment 1 04/05/2017 Notify RN   Final  . Comment 2 04/05/2017 Document in Chart   Final  . Glucose-Capillary 04/05/2017 121* 65 - 99 mg/dL Final  . Comment 1 04/05/2017 Notify RN   Final  . Comment 2 04/05/2017 Document in  Chart   Final  . Sodium 04/06/2017 142  135 - 145 mmol/L Final  . Potassium 04/06/2017 5.2* 3.5 - 5.1 mmol/L Final  . Chloride 04/06/2017 115* 101 - 111 mmol/L Final  . CO2 04/06/2017 24  22 - 32 mmol/L Final  . Glucose, Bld 04/06/2017 95  65 - 99 mg/dL Final  . BUN 04/06/2017 21* 6 - 20 mg/dL Final  . Creatinine, Ser 04/06/2017 1.12* 0.44 - 1.00 mg/dL Final  . Calcium 04/06/2017 8.6* 8.9 - 10.3 mg/dL Final  . GFR calc non Af Amer 04/06/2017 46* >60 mL/min Final  . GFR calc Af Amer 04/06/2017 53* >60 mL/min Final   Comment: (NOTE) The eGFR has been calculated using the CKD EPI equation. This calculation has not been validated in all clinical situations. eGFR's persistently <60 mL/min signify possible Chronic Kidney Disease.   . Anion gap 04/06/2017 3* 5 - 15 Final  . Glucose-Capillary 04/05/2017 127* 65 - 99 mg/dL Final  . Comment 1 04/05/2017 Notify RN   Final  . Comment 2 04/05/2017 Document in Chart   Final  . Glucose-Capillary 04/05/2017 116* 65 - 99 mg/dL Final  . Glucose-Capillary 04/06/2017 84  65 - 99 mg/dL Final  . Glucose-Capillary 04/06/2017 93  65 - 99 mg/dL Final  . Glucose-Capillary 04/06/2017 141* 65 - 99 mg/dL  Final    Assessment:  Crystal Frederick is a 76 y.o. 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  She presented in 04/1998 with a 4 x 2.2 x 2.2 cm right breast cancer.  She underwent lumpectomy.  Tumor was ER, PR, and Her2/neu negative.  She received adjuvant adriamycin and Cytoxan (AC) followed by Taxol.  She received radiation.  She developed recurrent disease in 12/2000.  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  She underwent modified right radical mastectomy.  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  She developed left breast cancer in 11/2009.  She underwent mastectomy in 12/2009.  Tumor was ER positive, PR positive,  and  Her2/neu negative.  Oncotype score was 33 (high).  She received 6 cycles of Taxotere and Cytoxan (TC) from 01/29/2010 - 05/15/2010.  She completed left chest wall radiation in 07/2010.  Arimidex started in 07/2010.  She completed 5 years of therapy.    CA 27-29 has been followed: 30.9 on 05/23/2015 and 30.5 on 12/12/2015.  She fell in 10/2013 and sustained an orbital fracture.  CT face revealed a subpleural density in both upper lobes felt secondary to scarring.  She is being followed by Dr. Raul Del of pulmonary medicine.   She had an intermittent headache and a pressure sensation on the right side of her head and balance problem for 4-5 months.  Head MRI on 11/30/2014 revealed no metastatic disease. She had small areas of hemosiderin both anterior frontal lobes and also the right posterior temporal lobe sequela of trauma. There was chronic left sided MCA infarct. Cervical spine MRI on 11/30/2014 revealed subtle signal changes along the ventral C4 and C5-C6 level suspicious for posterior anterior ligamentous injury. There was widespread cervical spine disc and endplate degeneration. There was no evidence of metastatic disease.  Bone density on 06/04/2015 at the [facility] revealed osteoporosis with a T score of -3.2 in the forearm, -2.3 in the left femoral neck, and -0.9 in the left hip.  She is on Fosamax weekly.  She has a history of a normocytic anemia.  She has a history of Celebrex-induced gastritis/ulcer.  Colonoscopy on 03/05/2015 was "okay" per patient report. She denies any melena or hematochezia.  She eats meat about 3-4 times a week. She denies any pica.  Work-up on 06/20/2015 revealed the following normal studies: hematocrit (37.2), reticulocyte count, ferritin, B12, folate, and TSH.   Symptomatically, she denies any breast concerns.  She has bilateral hand pain (right > left).  Exam reveals post-operative changes.  Plan: 1.  Labs today:  CBC with diff, CMP, CA27.29.  2.   Discuss benefit of adjuvant hormonal therapy for 5 versus 10 years.  She has completed 5 years.  Discuss Breast Cancer Index (BCI) testing.  She is interested in testing. 3.  Continue calcium and vitamin D.  Patient on Fosamax for osteoporosis. 4.  RTC after BCI test results available.   Lequita Asal, MD  05/21/2017, 6:27 AM

## 2017-07-18 DEATH — deceased
